# Patient Record
Sex: Female | Born: 1990 | Race: Black or African American | Hispanic: No | Smoking: Never smoker
Health system: Southern US, Community
[De-identification: ages and names within clinical notes are randomized; demographics above are authoritative.]

## PROBLEM LIST (undated history)

## (undated) DIAGNOSIS — Z789 Other specified health status: Secondary | ICD-10-CM

## (undated) HISTORY — PX: NO PAST SURGERIES: SHX2092

---

## 2014-07-06 ENCOUNTER — Emergency Department (HOSPITAL_COMMUNITY)
Admission: EM | Admit: 2014-07-06 | Discharge: 2014-07-06 | Disposition: A | Discharge status: Home / Self Care | Payer: BC Managed Care – PPO | Attending: Emergency Medicine | Admitting: Emergency Medicine

## 2014-07-06 ENCOUNTER — Encounter (HOSPITAL_COMMUNITY): Payer: Self-pay | Admitting: Emergency Medicine

## 2014-07-06 DIAGNOSIS — J029 Acute pharyngitis, unspecified: Secondary | ICD-10-CM | POA: Diagnosis present

## 2014-07-06 DIAGNOSIS — R6883 Chills (without fever): Secondary | ICD-10-CM | POA: Insufficient documentation

## 2014-07-06 LAB — RAPID STREP SCREEN (MED CTR MEBANE ONLY): Streptococcus, Group A Screen (Direct): NEGATIVE

## 2014-07-06 MED ORDER — LIDOCAINE VISCOUS 2 % MT SOLN
15.0000 mL | Freq: Once | OROMUCOSAL | Status: AC
  Administered 2014-07-06: 15 mL via OROMUCOSAL
  Filled 2014-07-06: qty 15

## 2014-07-06 MED ORDER — LIDOCAINE VISCOUS 2 % MT SOLN: 20.0000 mL | OROMUCOSAL | Status: DC | PRN

## 2014-07-06 MED ORDER — IBUPROFEN 800 MG PO TABS: 800.0000 mg | ORAL_TABLET | Freq: Three times a day (TID) | ORAL | Status: DC

## 2014-07-06 NOTE — ED Notes (Signed)
Pt reports onset sore throat Thursday and has noticed white specks in the back of her throat. Pt wants to be checked for strep throat.

## 2014-07-06 NOTE — Discharge Instructions (Signed)

## 2014-07-06 NOTE — ED Provider Notes (Signed)
Medical screening examination/treatment/procedure(s) were performed by non-physician practitioner and as supervising physician I was immediately available for consultation/collaboration.   EKG Interpretation None      Wania Longstreth, MD, FACEP   Grace Thrun L Shakirah Kirkey, MD 07/06/14 2231 

## 2014-07-06 NOTE — ED Provider Notes (Signed)
CSN: [id number]     Arrival date & time 07/06/14  1603 History  This chart was scribed for a non-physician practitioner, Terri Piedraourtney Forcucci, PA-C working with Ward GivensIva L Knapp, MD by [place] Peace, ED Scribe. The patient was seen in [facility]. The patient's care was started at 4:55 PM.    Chief Complaint  Patient presents with  . Sore Throat      Patient is a 23 y.o. female presenting with pharyngitis. The history is provided by the patient. No language interpreter was used.  Sore Throat Pertinent negatives include no shortness of breath.  HPI Comments: Gershon CullLeah Rahn is a 23 y.o. female who presents to the Emergency Department complaining of sore throat onset Thursday with associated chills. Pt notes white specks to back of throat. She has tried taking Tylenol and cough drops without relief. Pt expresses that she thinks she has strep throat. Pt's friend who is here with her at ED states that he has been sick recently but doesn't believe he had sore throat. She denies cough, fever, SOB, chest pain, abdominal pain, nausea, and vomiting.   History reviewed. No pertinent past medical history. History reviewed. No pertinent past surgical history. No family history on file. History  Substance Use Topics  . Smoking status: Never Smoker   . Smokeless tobacco: Not on file  . Alcohol Use: Yes   OB History   Grav Para Term Preterm Abortions TAB SAB Ect Mult Living                 Review of Systems  Constitutional: Positive for chills. Negative for fever.  HENT: Positive for sore throat.   Respiratory: Negative for cough and shortness of breath.   Gastrointestinal: Negative for nausea and vomiting.  All other systems reviewed and are negative.     Allergies  Review of patient's allergies indicates no known allergies.  Home Medications   Prior to Admission medications   Medication Sig Start Date End Date Taking? Authorizing Provider  acetaminophen (TYLENOL) 500 MG tablet Take 1,500 mg by  mouth every 6 (six) hours as needed (sore throat.).   Yes Historical Provider, MD  ibuprofen (ADVIL,MOTRIN) 200 MG tablet Take 600 mg by mouth every 6 (six) hours as needed (sore throat).   Yes Historical Provider, MD  ibuprofen (ADVIL,MOTRIN) 800 MG tablet Take 1 tablet (800 mg total) by mouth 3 (three) times daily. 07/06/14   Ashtan Girtman A Forcucci, PA-C  lidocaine (XYLOCAINE) 2 % solution Use as directed 20 mLs in the mouth or throat as needed for mouth pain. 07/06/14   Arrie Zuercher A Forcucci, PA-C   BP 117/70  Pulse 107  Temp(Src) 99.2 F (37.3 C) (Oral)  Resp 20  SpO2 100%  LMP 06/08/2014 Physical Exam  Nursing note and vitals reviewed. Constitutional: She is oriented to person, place, and time. She appears well-developed and well-nourished. No distress.  HENT:  Head: Normocephalic and atraumatic.  Mouth/Throat: Oropharyngeal exudate and posterior oropharyngeal edema present.  Eyes: Conjunctivae and EOM are normal. Pupils are equal, round, and reactive to light.  Neck: Neck supple. No tracheal deviation present.  Cardiovascular: Normal rate, regular rhythm and normal heart sounds.  Exam reveals no gallop and no friction rub.   No murmur heard. Pulmonary/Chest: Effort normal and breath sounds normal. No respiratory distress. She has no wheezes. She has no rales.  Musculoskeletal: Normal range of motion.  Lymphadenopathy:    She has cervical adenopathy.  Neurological: She is alert and oriented to person, place, and time.  Skin: Skin is warm and dry.  Psychiatric: She has a normal mood and affect. Her behavior is normal.    ED Course  Procedures (including critical care time) Labs Review Labs Reviewed  RAPID STREP SCREEN  CULTURE, GROUP A STREP    Results for orders placed during the hospital encounter of 07/06/14  RAPID STREP SCREEN      Result Value Ref Range   Streptococcus, Group A Screen (Direct) NEGATIVE  NEGATIVE   No results found.   Imaging Review No results  found.   EKG Interpretation None     Medications  lidocaine (XYLOCAINE) 2 % viscous mouth solution 15 mL (not administered)    4:59 PM- Treatment plan was discussed with patient who verbalizes understanding and agrees.   MDM   Final diagnoses:  Viral pharyngitis   Patient is a 23 y.o. feamle who presents to the ED with sore throat.  Patient is afebrile and non-toxic appearing.  There is no evidence of peritonsillar abscess or trismus.  Rapid strep is negative.  Suspect that this is likely viral pharyngitis.  Will send the patient home with ibuprofen 800 mg TID and viscous lidocaine for symptomatic relief.  Patient states understanding and agreement.    I personally performed the services described in this documentation, which was scribed in my presence. The recorded information has been reviewed and is accurate.   Eben Burowourtney A Forcucci, PA-C 07/06/14 1723

## 2014-07-08 LAB — CULTURE, GROUP A STREP

## 2015-11-23 ENCOUNTER — Other Ambulatory Visit (HOSPITAL_COMMUNITY): Payer: Self-pay | Admitting: Nurse Practitioner

## 2015-11-23 DIAGNOSIS — Z369 Encounter for antenatal screening, unspecified: Secondary | ICD-10-CM

## 2015-11-23 LAB — OB RESULTS CONSOLE HEPATITIS B SURFACE ANTIGEN: Hepatitis B Surface Ag: NEGATIVE

## 2015-11-23 LAB — OB RESULTS CONSOLE RUBELLA ANTIBODY, IGM: RUBELLA: IMMUNE

## 2015-11-27 ENCOUNTER — Encounter (HOSPITAL_COMMUNITY): Payer: Self-pay

## 2015-11-27 ENCOUNTER — Ambulatory Visit (HOSPITAL_COMMUNITY)
Admission: RE | Admit: 2015-11-27 | Discharge: 2015-11-27 | Disposition: A | Discharge status: Home / Self Care | Payer: Medicaid Other | Source: Ambulatory Visit | Attending: Nurse Practitioner | Admitting: Nurse Practitioner

## 2015-11-27 DIAGNOSIS — Z369 Encounter for antenatal screening, unspecified: Secondary | ICD-10-CM

## 2015-11-27 DIAGNOSIS — Z36 Encounter for antenatal screening of mother: Secondary | ICD-10-CM | POA: Insufficient documentation

## 2015-11-27 DIAGNOSIS — Z3A13 13 weeks gestation of pregnancy: Secondary | ICD-10-CM | POA: Diagnosis not present

## 2015-12-07 ENCOUNTER — Other Ambulatory Visit (HOSPITAL_COMMUNITY): Payer: Self-pay

## 2015-12-17 ENCOUNTER — Other Ambulatory Visit (HOSPITAL_COMMUNITY): Payer: Self-pay | Admitting: Nurse Practitioner

## 2015-12-17 DIAGNOSIS — Z3689 Encounter for other specified antenatal screening: Secondary | ICD-10-CM

## 2015-12-17 DIAGNOSIS — Z3A19 19 weeks gestation of pregnancy: Secondary | ICD-10-CM

## 2016-01-08 ENCOUNTER — Ambulatory Visit (HOSPITAL_COMMUNITY)
Admission: RE | Admit: 2016-01-08 | Discharge: 2016-01-08 | Disposition: A | Discharge status: Home / Self Care | Payer: Medicaid Other | Source: Ambulatory Visit | Attending: Nurse Practitioner | Admitting: Nurse Practitioner

## 2016-01-08 DIAGNOSIS — Z36 Encounter for antenatal screening of mother: Secondary | ICD-10-CM | POA: Insufficient documentation

## 2016-01-08 DIAGNOSIS — Z3689 Encounter for other specified antenatal screening: Secondary | ICD-10-CM

## 2016-01-08 DIAGNOSIS — Z3A19 19 weeks gestation of pregnancy: Secondary | ICD-10-CM | POA: Diagnosis not present

## 2016-03-09 LAB — OB RESULTS CONSOLE GC/CHLAMYDIA
Chlamydia: NEGATIVE
Gonorrhea: NEGATIVE

## 2016-03-09 LAB — OB RESULTS CONSOLE HIV ANTIBODY (ROUTINE TESTING): HIV: NONREACTIVE

## 2016-05-11 LAB — OB RESULTS CONSOLE GBS: STREP GROUP B AG: POSITIVE

## 2016-05-27 ENCOUNTER — Encounter (HOSPITAL_COMMUNITY): Payer: Self-pay | Admitting: *Deleted

## 2016-05-27 ENCOUNTER — Inpatient Hospital Stay (HOSPITAL_COMMUNITY)
Admission: AD | Admit: 2016-05-27 | Discharge: 2016-05-27 | Disposition: A | Discharge status: Home / Self Care | Payer: Medicaid Other | Source: Ambulatory Visit | Attending: Obstetrics and Gynecology | Admitting: Obstetrics and Gynecology

## 2016-05-27 DIAGNOSIS — Z3A37 37 weeks gestation of pregnancy: Secondary | ICD-10-CM | POA: Insufficient documentation

## 2016-05-27 DIAGNOSIS — N858 Other specified noninflammatory disorders of uterus: Secondary | ICD-10-CM | POA: Diagnosis present

## 2016-05-27 HISTORY — DX: Other specified health status: Z78.9

## 2016-05-27 NOTE — Discharge Instructions (Signed)
Fetal Movement Counts °Patient Name: __________________________________________________ Patient Due Date: ____________________ °Performing a fetal movement count is highly recommended in high-risk pregnancies, but it is good for every pregnant woman to do. Your health care provider may ask you to start counting fetal movements at 28 weeks of the pregnancy. Fetal movements often increase: °· After eating a full meal. °· After physical activity. °· After eating or drinking something sweet or cold. °· At rest. °Pay attention to when you feel the baby is most active. This will help you notice a pattern of your baby's sleep and wake cycles and what factors contribute to an increase in fetal movement. It is important to perform a fetal movement count at the same time each day when your baby is normally most active.  °HOW TO COUNT FETAL MOVEMENTS °1. Find a quiet and comfortable area to sit or lie down on your left side. Lying on your left side provides the best blood and oxygen circulation to your baby. °2. Write down the day and time on a sheet of paper or in a journal. °3. Start counting kicks, flutters, swishes, rolls, or jabs in a 2-hour period. You should feel at least 10 movements within 2 hours. °4. If you do not feel 10 movements in 2 hours, wait 2-3 hours and count again. Look for a change in the pattern or not enough counts in 2 hours. °SEEK MEDICAL CARE IF: °· You feel less than 10 counts in 2 hours, tried twice. °· There is no movement in over an hour. °· The pattern is changing or taking longer each day to reach 10 counts in 2 hours. °· You feel the baby is not moving as he or she usually does. °Date: ____________ Movements: ____________ Start time: ____________ Finish time: ____________  °Date: ____________ Movements: ____________ Start time: ____________ Finish time: ____________ °Date: ____________ Movements: ____________ Start time: ____________ Finish time: ____________ °Date: ____________ Movements:  ____________ Start time: ____________ Finish time: ____________ °Date: ____________ Movements: ____________ Start time: ____________ Finish time: ____________ °Date: ____________ Movements: ____________ Start time: ____________ Finish time: ____________ °Date: ____________ Movements: ____________ Start time: ____________ Finish time: ____________ °Date: ____________ Movements: ____________ Start time: ____________ Finish time: ____________  °Date: ____________ Movements: ____________ Start time: ____________ Finish time: ____________ °Date: ____________ Movements: ____________ Start time: ____________ Finish time: ____________ °Date: ____________ Movements: ____________ Start time: ____________ Finish time: ____________ °Date: ____________ Movements: ____________ Start time: ____________ Finish time: ____________ °Date: ____________ Movements: ____________ Start time: ____________ Finish time: ____________ °Date: ____________ Movements: ____________ Start time: ____________ Finish time: ____________ °Date: ____________ Movements: ____________ Start time: ____________ Finish time: ____________  °Date: ____________ Movements: ____________ Start time: ____________ Finish time: ____________ °Date: ____________ Movements: ____________ Start time: ____________ Finish time: ____________ °Date: ____________ Movements: ____________ Start time: ____________ Finish time: ____________ °Date: ____________ Movements: ____________ Start time: ____________ Finish time: ____________ °Date: ____________ Movements: ____________ Start time: ____________ Finish time: ____________ °Date: ____________ Movements: ____________ Start time: ____________ Finish time: ____________ °Date: ____________ Movements: ____________ Start time: ____________ Finish time: ____________  °Date: ____________ Movements: ____________ Start time: ____________ Finish time: ____________ °Date: ____________ Movements: ____________ Start time: ____________ Finish  time: ____________ °Date: ____________ Movements: ____________ Start time: ____________ Finish time: ____________ °Date: ____________ Movements: ____________ Start time: ____________ Finish time: ____________ °Date: ____________ Movements: ____________ Start time: ____________ Finish time: ____________ °Date: ____________ Movements: ____________ Start time: ____________ Finish time: ____________ °Date: ____________ Movements: ____________ Start time: ____________ Finish time: ____________  °Date: ____________ Movements: ____________ Start time: ____________ Finish   time: ____________ °Date: ____________ Movements: ____________ Start time: ____________ Finish time: ____________ °Date: ____________ Movements: ____________ Start time: ____________ Finish time: ____________ °Date: ____________ Movements: ____________ Start time: ____________ Finish time: ____________ °Date: ____________ Movements: ____________ Start time: ____________ Finish time: ____________ °Date: ____________ Movements: ____________ Start time: ____________ Finish time: ____________ °Date: ____________ Movements: ____________ Start time: ____________ Finish time: ____________  °Date: ____________ Movements: ____________ Start time: ____________ Finish time: ____________ °Date: ____________ Movements: ____________ Start time: ____________ Finish time: ____________ °Date: ____________ Movements: ____________ Start time: ____________ Finish time: ____________ °Date: ____________ Movements: ____________ Start time: ____________ Finish time: ____________ °Date: ____________ Movements: ____________ Start time: ____________ Finish time: ____________ °Date: ____________ Movements: ____________ Start time: ____________ Finish time: ____________ °Date: ____________ Movements: ____________ Start time: ____________ Finish time: ____________  °Date: ____________ Movements: ____________ Start time: ____________ Finish time: ____________ °Date: ____________  Movements: ____________ Start time: ____________ Finish time: ____________ °Date: ____________ Movements: ____________ Start time: ____________ Finish time: ____________ °Date: ____________ Movements: ____________ Start time: ____________ Finish time: ____________ °Date: ____________ Movements: ____________ Start time: ____________ Finish time: ____________ °Date: ____________ Movements: ____________ Start time: ____________ Finish time: ____________ °Date: ____________ Movements: ____________ Start time: ____________ Finish time: ____________  °Date: ____________ Movements: ____________ Start time: ____________ Finish time: ____________ °Date: ____________ Movements: ____________ Start time: ____________ Finish time: ____________ °Date: ____________ Movements: ____________ Start time: ____________ Finish time: ____________ °Date: ____________ Movements: ____________ Start time: ____________ Finish time: ____________ °Date: ____________ Movements: ____________ Start time: ____________ Finish time: ____________ °Date: ____________ Movements: ____________ Start time: ____________ Finish time: ____________ °  °This information is not intended to replace advice given to you by your health care provider. Make sure you discuss any questions you have with your health care provider. °  °Document Released: 09/28/2006 Document Revised: 09/19/2014 Document Reviewed: 06/25/2012 °Elsevier Interactive Patient Education ©2016 [employer] °Third Trimester of Pregnancy °The third trimester is from week 29 through week 42, months 7 through 9. The third trimester is a time when the fetus is growing rapidly. At the end of the ninth month, the fetus is about 20 inches in length and weighs 6-10 pounds.  °BODY CHANGES °Your body goes through many changes during pregnancy. The changes vary from woman to woman.  °· Your weight will continue to increase. You can expect to gain 25-35 pounds (11-16 kg) by the end of the pregnancy. °· You may  begin to get stretch marks on your hips, abdomen, and breasts. °· You may urinate more often because the fetus is moving lower into your pelvis and pressing on your bladder. °· You may develop or continue to have heartburn as a result of your pregnancy. °· You may develop constipation because certain hormones are causing the muscles that push waste through your intestines to slow down. °· You may develop hemorrhoids or swollen, bulging veins (varicose veins). °· You may have pelvic pain because of the weight gain and pregnancy hormones relaxing your joints between the bones in your pelvis. Backaches may result from overexertion of the muscles supporting your posture. °· You may have changes in your hair. These can include thickening of your hair, rapid growth, and changes in texture. Some women also have hair loss during or after pregnancy, or hair that feels dry or thin. Your hair will most likely return to normal after your baby is born. °· Your breasts will continue to grow and be tender. A yellow discharge may leak from your breasts called colostrum. °· Your belly button may stick out. °·   You may feel short of breath because of your expanding uterus.  You may notice the fetus "dropping," or moving lower in your abdomen.  You may have a bloody mucus discharge. This usually occurs a few days to a week before labor begins.  Your cervix becomes thin and soft (effaced) near your due date. WHAT TO EXPECT AT YOUR PRENATAL EXAMS  You will have prenatal exams every 2 weeks until week 36. Then, you will have weekly prenatal exams. During a routine prenatal visit:  You will be weighed to make sure you and the fetus are growing normally.  Your blood pressure is taken.  Your abdomen will be measured to track your baby's growth.  The fetal heartbeat will be listened to.  Any test results from the previous visit will be discussed.  You may have a cervical check near your due date to see if you have  effaced. At around 36 weeks, your caregiver will check your cervix. At the same time, your caregiver will also perform a test on the secretions of the vaginal tissue. This test is to determine if a type of bacteria, Group B streptococcus, is present. Your caregiver will explain this further. Your caregiver may ask you:  What your birth plan is.  How you are feeling.  If you are feeling the baby move.  If you have had any abnormal symptoms, such as leaking fluid, bleeding, severe headaches, or abdominal cramping.  If you are using any tobacco products, including cigarettes, chewing tobacco, and electronic cigarettes.  If you have any questions. Other tests or screenings that may be performed during your third trimester include:  Blood tests that check for low iron levels (anemia).  Fetal testing to check the health, activity level, and growth of the fetus. Testing is done if you have certain medical conditions or if there are problems during the pregnancy.  HIV (human immunodeficiency virus) testing. If you are at high risk, you may be screened for HIV during your third trimester of pregnancy. FALSE LABOR You may feel small, irregular contractions that eventually go away. These are called Braxton Hicks contractions, or false labor. Contractions may last for hours, days, or even weeks before true labor sets in. If contractions come at regular intervals, intensify, or become painful, it is best to be seen by your caregiver.  SIGNS OF LABOR   Menstrual-like cramps.  Contractions that are 5 minutes apart or less.  Contractions that start on the top of the uterus and spread down to the lower abdomen and back.  A sense of increased pelvic pressure or back pain.  A watery or bloody mucus discharge that comes from the vagina. If you have any of these signs before the 37th week of pregnancy, call your caregiver right away. You need to go to the hospital to get checked immediately. HOME CARE  INSTRUCTIONS   Avoid all smoking, herbs, alcohol, and unprescribed drugs. These chemicals affect the formation and growth of the baby.  Do not use any tobacco products, including cigarettes, chewing tobacco, and electronic cigarettes. If you need help quitting, ask your health care provider. You may receive counseling support and other resources to help you quit.  Follow your caregiver's instructions regarding medicine use. There are medicines that are either safe or unsafe to take during pregnancy.  Exercise only as directed by your caregiver. Experiencing uterine cramps is a good sign to stop exercising.  Continue to eat regular, healthy meals.  Wear a good support bra for  breast tenderness.  Do not use hot tubs, steam rooms, or saunas.  Wear your seat belt at all times when driving.  Avoid raw meat, uncooked cheese, cat litter boxes, and soil used by cats. These carry germs that can cause birth defects in the baby.  Take your prenatal vitamins.  Take 1500-2000 mg of calcium daily starting at the 20th week of pregnancy until you deliver your baby.  Try taking a stool softener (if your caregiver approves) if you develop constipation. Eat more high-fiber foods, such as fresh vegetables or fruit and whole grains. Drink plenty of fluids to keep your urine clear or pale yellow.  Take warm sitz baths to soothe any pain or discomfort caused by hemorrhoids. Use hemorrhoid cream if your caregiver approves.  If you develop varicose veins, wear support hose. Elevate your feet for 15 minutes, 3-4 times a day. Limit salt in your diet.  Avoid heavy lifting, wear low heal shoes, and practice good posture.  Rest a lot with your legs elevated if you have leg cramps or low back pain.  Visit your dentist if you have not gone during your pregnancy. Use a soft toothbrush to brush your teeth and be gentle when you floss.  A sexual relationship may be continued unless your caregiver directs you  otherwise.  Do not travel far distances unless it is absolutely necessary and only with the approval of your caregiver.  Take prenatal classes to understand, practice, and ask questions about the labor and delivery.  Make a trial run to the hospital.  Pack your hospital bag.  Prepare the baby's nursery.  Continue to go to all your prenatal visits as directed by your caregiver. SEEK MEDICAL CARE IF:  You are unsure if you are in labor or if your water has broken.  You have dizziness.  You have mild pelvic cramps, pelvic pressure, or nagging pain in your abdominal area.  You have persistent nausea, vomiting, or diarrhea.  You have a bad smelling vaginal discharge.  You have pain with urination. SEEK IMMEDIATE MEDICAL CARE IF:   You have a fever.  You are leaking fluid from your vagina.  You have spotting or bleeding from your vagina.  You have severe abdominal cramping or pain.  You have rapid weight loss or gain.  You have shortness of breath with chest pain.  You notice sudden or extreme swelling of your face, hands, ankles, feet, or legs.  You have not felt your baby move in over an hour.  You have severe headaches that do not go away with medicine.  You have vision changes.   This information is not intended to replace advice given to you by your health care provider. Make sure you discuss any questions you have with your health care provider.   Document Released: 08/23/2001 Document Revised: 09/19/2014 Document Reviewed: 10/30/2012 Elsevier Interactive Patient Education 2016 [employer] Inc[employer] Ball CorporationBraxton Hicks Contractions Contractions of the uterus can occur throughout pregnancy. Contractions are not always a sign that you are in labor.  WHAT ARE BRAXTON HICKS CONTRACTIONS?  Contractions that occur before labor are called Braxton Hicks contractions, or false labor. Toward the end of pregnancy (32-34 weeks), these contractions can develop more often and may become  more forceful. This is not true labor because these contractions do not result in opening (dilatation) and thinning of the cervix. They are sometimes difficult to tell apart from true labor because these contractions can be forceful and people have different pain tolerances. You should  not feel embarrassed if you go to the hospital with false labor. Sometimes, the only way to tell if you are in true labor is for your health care provider to look for changes in the cervix. If there are no prenatal problems or other health problems associated with the pregnancy, it is completely safe to be sent home with false labor and await the onset of true labor. HOW CAN YOU TELL THE DIFFERENCE BETWEEN TRUE AND FALSE LABOR? False Labor  The contractions of false labor are usually shorter and not as hard as those of true labor.   The contractions are usually irregular.   The contractions are often felt in the front of the lower abdomen and in the groin.   The contractions may go away when you walk around or change positions while lying down.   The contractions get weaker and are shorter lasting as time goes on.   The contractions do not usually become progressively stronger, regular, and closer together as with true labor.  True Labor  Contractions in true labor last 30-70 seconds, become very regular, usually become more intense, and increase in frequency.   The contractions do not go away with walking.   The discomfort is usually felt in the top of the uterus and spreads to the lower abdomen and low back.   True labor can be determined by your health care provider with an exam. This will show that the cervix is dilating and getting thinner.  WHAT TO REMEMBER  Keep up with your usual exercises and follow other instructions given by your health care provider.   Take medicines as directed by your health care provider.   Keep your regular prenatal appointments.   Eat and drink lightly if  you think you are going into labor.   If Braxton Hicks contractions are making you uncomfortable:   Change your position from lying down or resting to walking, or from walking to resting.   Sit and rest in a tub of warm water.   Drink 2-3 glasses of water. Dehydration may cause these contractions.   Do slow and deep breathing several times an hour.  WHEN SHOULD I SEEK IMMEDIATE MEDICAL CARE? Seek immediate medical care if:  Your contractions become stronger, more regular, and closer together.   You have fluid leaking or gushing from your vagina.   You have a fever.   You pass blood-tinged mucus.   You have vaginal bleeding.   You have continuous abdominal pain.   You have low back pain that you never had before.   You feel your baby's head pushing down and causing pelvic pressure.   Your baby is not moving as much as it used to.    This information is not intended to replace advice given to you by your health care provider. Make sure you discuss any questions you have with your health care provider.   Document Released: 08/29/2005 Document Revised: 09/03/2013 Document Reviewed: 06/10/2013 Elsevier Interactive Patient Education [employer]

## 2016-05-27 NOTE — MAU Note (Signed)
PT SAYS SHE HAD UC HURTING   BAD  AT 8PM. PNC-  HD.  VE   AT 37 WEEKS -   1 CM.     DENIES HSV AND  MRSA.  GBS- POSITIVE

## 2016-05-28 ENCOUNTER — Inpatient Hospital Stay (HOSPITAL_COMMUNITY)
Admission: AD | Admit: 2016-05-28 | Discharge: 2016-05-29 | Disposition: A | Discharge status: Home / Self Care | Payer: Medicaid Other | Source: Ambulatory Visit | Attending: Obstetrics & Gynecology | Admitting: Obstetrics & Gynecology

## 2016-05-28 ENCOUNTER — Encounter (HOSPITAL_COMMUNITY): Payer: Self-pay | Admitting: *Deleted

## 2016-05-28 MED ORDER — OXYCODONE-ACETAMINOPHEN 5-325 MG PO TABS
1.0000 | ORAL_TABLET | Freq: Once | ORAL | Status: AC
  Administered 2016-05-28: 1 via ORAL
  Filled 2016-05-28: qty 1

## 2016-05-28 NOTE — Progress Notes (Signed)
Pt to ambulate for one hour then return to MAU for reevaluation

## 2016-05-29 ENCOUNTER — Inpatient Hospital Stay (HOSPITAL_COMMUNITY)
Admission: AD | Admit: 2016-05-29 | Discharge: 2016-05-29 | Disposition: A | Discharge status: Home / Self Care | Payer: Medicaid Other | Source: Ambulatory Visit | Attending: Obstetrics and Gynecology | Admitting: Obstetrics and Gynecology

## 2016-05-29 ENCOUNTER — Encounter (HOSPITAL_COMMUNITY): Payer: Self-pay | Admitting: *Deleted

## 2016-05-29 NOTE — MAU Note (Signed)
Pt states her contractions are more intense today.  Pt states she has some contractions that are back to back and others that are 5 minutes apart.  Pt states she is feeling the baby move.  Pt denies water breaking.  Pt states she feels like her mucus plug is still coming out.

## 2016-05-29 NOTE — Discharge Instructions (Signed)
Vaginal Delivery °During delivery, your health care provider will help you give birth to your baby. During a vaginal delivery, you will work to push the baby out of your vagina. However, before you can push your baby out, a few things need to happen. The opening of your uterus (cervix) has to soften, thin out, and open up (dilate) all the way to 10 cm. Also, your baby has to move down from the uterus into your vagina.  °SIGNS OF LABOR  °Your health care provider will first need to make sure you are in labor. Signs of labor include:  °· Passing what is called the mucous plug before labor begins. This is a small amount of blood-stained mucus. °· Having regular, painful uterine contractions.   °· The time between contractions gets shorter.   °· The discomfort and pain gradually get more intense. °· Contraction pains get worse when walking and do not go away when resting.   °· Your cervix becomes thinner (effacement) and dilates. °BEFORE THE DELIVERY °Once you are in labor and admitted into the hospital or care center, your health care provider may do the following:  °· Perform a complete physical exam. °· Review any complications related to pregnancy or labor.  °· Check your blood pressure, pulse, temperature, and heart rate (vital signs).   °· Determine if, and when, the rupture of amniotic membranes occurred. °· Do a vaginal exam (using a sterile glove and lubricant) to determine:   °¨ The position (presentation) of the baby. Is the baby's head presenting first (vertex) in the birth canal (vagina), or are the feet or buttocks first (breech)?   °¨ The level (station) of the baby's head within the birth canal.   °¨ The effacement and dilatation of the cervix.   °· An electronic fetal monitor is usually placed on your abdomen when you first arrive. This is used to monitor your contractions and the baby's heart rate. °¨ When the monitor is on your abdomen (external fetal monitor), it can only pick up the frequency and  length of your contractions. It cannot tell the strength of your contractions. °¨ If it becomes necessary for your health care provider to know exactly how strong your contractions are or to see exactly what the baby's heart rate is doing, an internal monitor may be inserted into your vagina and uterus. Your health care provider will discuss the benefits and risks of using an internal monitor and obtain your permission before inserting the device. °¨ Continuous fetal monitoring may be needed if you have an epidural, are receiving certain medicines (such as oxytocin), or have pregnancy or labor complications. °· An IV access tube may be placed into a vein in your arm to deliver fluids and medicines if necessary. °THREE STAGES OF LABOR AND DELIVERY °Normal labor and delivery is divided into three stages. °First Stage °This stage starts when you begin to contract regularly and your cervix begins to efface and dilate. It ends when your cervix is completely open (fully dilated). The first stage is the longest stage of labor and can last from 3 hours to 15 hours.  °Several methods are available to help with labor pain. You and your health care provider will decide which option is best for you. Options include:  °· Opioid medicines. These are strong pain medicines that you can get through your IV tube or as a shot into your muscle. These medicines lessen pain but do not make it go away completely.  °· Epidural. A medicine is given through a thin tube that   is inserted in your back. The medicine numbs the lower part of your body and prevents any pain in that area.  Paracervical pain medicine. This is an injection of an anesthetic on each side of your cervix.   You may request natural childbirth, which does not involve the use of pain medicines or an epidural during labor and delivery. Instead, you will use other things, such as breathing exercises, to help cope with the pain. Second Stage The second stage of labor  begins when your cervix is fully dilated at 10 cm. It continues until you push your baby down through the birth canal and the baby is born. This stage can take only minutes or several hours.  The location of your baby's head as it moves through the birth canal is reported as a number called a station. If the baby's head has not started its descent, the station is described as being at minus 3 (-3). When your baby's head is at the zero station, it is at the middle of the birth canal and is engaged in the pelvis. The station of your baby helps indicate the progress of the second stage of labor.  When your baby is born, your health care provider may hold the baby with his or her head lowered to prevent amniotic fluid, mucus, and blood from getting into the baby's lungs. The baby's mouth and nose may be suctioned with a small bulb syringe to remove any additional fluid.  Your health care provider may then place the baby on your stomach. It is important to keep the baby from getting cold. To do this, the health care provider will dry the baby off, place the baby directly on your skin (with no blankets between you and the baby), and cover the baby with warm, dry blankets.   The umbilical cord is cut. Third Stage During the third stage of labor, your health care provider will deliver the placenta (afterbirth) and make sure your bleeding is under control. The delivery of the placenta usually takes about 5 minutes but can take up to 30 minutes. After the placenta is delivered, a medicine may be given either by IV or injection to help contract the uterus and control bleeding. If you are planning to breastfeed, you can try to do so now. After you deliver the placenta, your uterus should contract and get very firm. If your uterus does not remain firm, your health care provider will massage it. This is important because the contraction of the uterus helps cut off bleeding at the site where the placenta was attached  to your uterus. If your uterus does not contract properly and stay firm, you may continue to bleed heavily. If there is a lot of bleeding, medicines may be given to contract the uterus and stop the bleeding.    This information is not intended to replace advice given to you by your health care provider. Make sure you discuss any questions you have with your health care provider.   Document Released: 06/07/2008 Document Revised: 09/19/2014 Document Reviewed: 04/25/2012 Elsevier Interactive Patient Education [employer] Third Trimester of Pregnancy The third trimester is from week 29 through week 42, months 7 through 9. The third trimester is a time when the fetus is growing rapidly. At the end of the ninth month, the fetus is about 20 inches in length and weighs 6-10 pounds.  BODY CHANGES Your body goes through many changes during pregnancy. The changes vary from woman to woman.  Your weight will continue to increase. You can expect to gain 25-35 pounds (11-16 kg) by the end of the pregnancy.  You may begin to get stretch marks on your hips, abdomen, and breasts.  You may urinate more often because the fetus is moving lower into your pelvis and pressing on your bladder.  You may develop or continue to have heartburn as a result of your pregnancy.  You may develop constipation because certain hormones are causing the muscles that push waste through your intestines to slow down.  You may develop hemorrhoids or swollen, bulging veins (varicose veins).  You may have pelvic pain because of the weight gain and pregnancy hormones relaxing your joints between the bones in your pelvis. Backaches may result from overexertion of the muscles supporting your posture.  You may have changes in your hair. These can include thickening of your hair, rapid growth, and changes in texture. Some women also have hair loss during or after pregnancy, or hair that feels dry or thin. Your hair will most likely  return to normal after your baby is born.  Your breasts will continue to grow and be tender. A yellow discharge may leak from your breasts called colostrum.  Your belly button may stick out.  You may feel short of breath because of your expanding uterus.  You may notice the fetus "dropping," or moving lower in your abdomen.  You may have a bloody mucus discharge. This usually occurs a few days to a week before labor begins.  Your cervix becomes thin and soft (effaced) near your due date. WHAT TO EXPECT AT YOUR PRENATAL EXAMS  You will have prenatal exams every 2 weeks until week 36. Then, you will have weekly prenatal exams. During a routine prenatal visit:  You will be weighed to make sure you and the fetus are growing normally.  Your blood pressure is taken.  Your abdomen will be measured to track your baby's growth.  The fetal heartbeat will be listened to.  Any test results from the previous visit will be discussed.  You may have a cervical check near your due date to see if you have effaced. At around 36 weeks, your caregiver will check your cervix. At the same time, your caregiver will also perform a test on the secretions of the vaginal tissue. This test is to determine if a type of bacteria, Group B streptococcus, is present. Your caregiver will explain this further. Your caregiver may ask you:  What your birth plan is.  How you are feeling.  If you are feeling the baby move.  If you have had any abnormal symptoms, such as leaking fluid, bleeding, severe headaches, or abdominal cramping.  If you are using any tobacco products, including cigarettes, chewing tobacco, and electronic cigarettes.  If you have any questions. Other tests or screenings that may be performed during your third trimester include:  Blood tests that check for low iron levels (anemia).  Fetal testing to check the health, activity level, and growth of the fetus. Testing is done if you have  certain medical conditions or if there are problems during the pregnancy.  HIV (human immunodeficiency virus) testing. If you are at high risk, you may be screened for HIV during your third trimester of pregnancy. FALSE LABOR You may feel small, irregular contractions that eventually go away. These are called Braxton Hicks contractions, or false labor. Contractions may last for hours, days, or even weeks before true labor sets in. If contractions come  at regular intervals, intensify, or become painful, it is best to be seen by your caregiver.  SIGNS OF LABOR   Menstrual-like cramps.  Contractions that are 5 minutes apart or less.  Contractions that start on the top of the uterus and spread down to the lower abdomen and back.  A sense of increased pelvic pressure or back pain.  A watery or bloody mucus discharge that comes from the vagina. If you have any of these signs before the 37th week of pregnancy, call your caregiver right away. You need to go to the hospital to get checked immediately. HOME CARE INSTRUCTIONS   Avoid all smoking, herbs, alcohol, and unprescribed drugs. These chemicals affect the formation and growth of the baby.  Do not use any tobacco products, including cigarettes, chewing tobacco, and electronic cigarettes. If you need help quitting, ask your health care provider. You may receive counseling support and other resources to help you quit.  Follow your caregiver's instructions regarding medicine use. There are medicines that are either safe or unsafe to take during pregnancy.  Exercise only as directed by your caregiver. Experiencing uterine cramps is a good sign to stop exercising.  Continue to eat regular, healthy meals.  Wear a good support bra for breast tenderness.  Do not use hot tubs, steam rooms, or saunas.  Wear your seat belt at all times when driving.  Avoid raw meat, uncooked cheese, cat litter boxes, and soil used by cats. These carry germs that  can cause birth defects in the baby.  Take your prenatal vitamins.  Take 1500-2000 mg of calcium daily starting at the 20th week of pregnancy until you deliver your baby.  Try taking a stool softener (if your caregiver approves) if you develop constipation. Eat more high-fiber foods, such as fresh vegetables or fruit and whole grains. Drink plenty of fluids to keep your urine clear or pale yellow.  Take warm sitz baths to soothe any pain or discomfort caused by hemorrhoids. Use hemorrhoid cream if your caregiver approves.  If you develop varicose veins, wear support hose. Elevate your feet for 15 minutes, 3-4 times a day. Limit salt in your diet.  Avoid heavy lifting, wear low heal shoes, and practice good posture.  Rest a lot with your legs elevated if you have leg cramps or low back pain.  Visit your dentist if you have not gone during your pregnancy. Use a soft toothbrush to brush your teeth and be gentle when you floss.  A sexual relationship may be continued unless your caregiver directs you otherwise.  Do not travel far distances unless it is absolutely necessary and only with the approval of your caregiver.  Take prenatal classes to understand, practice, and ask questions about the labor and delivery.  Make a trial run to the hospital.  Pack your hospital bag.  Prepare the baby's nursery.  Continue to go to all your prenatal visits as directed by your caregiver. SEEK MEDICAL CARE IF:  You are unsure if you are in labor or if your water has broken.  You have dizziness.  You have mild pelvic cramps, pelvic pressure, or nagging pain in your abdominal area.  You have persistent nausea, vomiting, or diarrhea.  You have a bad smelling vaginal discharge.  You have pain with urination. SEEK IMMEDIATE MEDICAL CARE IF:   You have a fever.  You are leaking fluid from your vagina.  You have spotting or bleeding from your vagina.  You have severe abdominal cramping or  pain.  You have rapid weight loss or gain.  You have shortness of breath with chest pain.  You notice sudden or extreme swelling of your face, hands, ankles, feet, or legs.  You have not felt your baby move in over an hour.  You have severe headaches that do not go away with medicine.  You have vision changes.   This information is not intended to replace advice given to you by your health care provider. Make sure you discuss any questions you have with your health care provider.   Document Released: 08/23/2001 Document Revised: 09/19/2014 Document Reviewed: 10/30/2012 Elsevier Interactive Patient Education 2016 [employer] Inc[employer] Fetal Movement Counts Patient Name: __________________________________________________ Patient Due Date: ____________________ Performing a fetal movement count is highly recommended in high-risk pregnancies, but it is good for every pregnant woman to do. Your health care provider may ask you to start counting fetal movements at 28 weeks of the pregnancy. Fetal movements often increase:  After eating a full meal.  After physical activity.  After eating or drinking something sweet or cold.  At rest. Pay attention to when you feel the baby is most active. This will help you notice a pattern of your baby's sleep and wake cycles and what factors contribute to an increase in fetal movement. It is important to perform a fetal movement count at the same time each day when your baby is normally most active.  HOW TO COUNT FETAL MOVEMENTS 1. Find a quiet and comfortable area to sit or lie down on your left side. Lying on your left side provides the best blood and oxygen circulation to your baby. 2. Write down the day and time on a sheet of paper or in a journal. 3. Start counting kicks, flutters, swishes, rolls, or jabs in a 2-hour period. You should feel at least 10 movements within 2 hours. 4. If you do not feel 10 movements in 2 hours, wait 2-3 hours and count again.  Look for a change in the pattern or not enough counts in 2 hours. SEEK MEDICAL CARE IF:  You feel less than 10 counts in 2 hours, tried twice.  There is no movement in over an hour.  The pattern is changing or taking longer each day to reach 10 counts in 2 hours.  You feel the baby is not moving as he or she usually does. Date: ____________ Movements: ____________ Start time: ____________ Doreatha Martin time: ____________  Date: ____________ Movements: ____________ Start time: ____________ Doreatha Martin time: ____________ Date: ____________ Movements: ____________ Start time: ____________ Doreatha Martin time: ____________ Date: ____________ Movements: ____________ Start time: ____________ Doreatha Martin time: ____________ Date: ____________ Movements: ____________ Start time: ____________ Doreatha Martin time: ____________ Date: ____________ Movements: ____________ Start time: ____________ Doreatha Martin time: ____________ Date: ____________ Movements: ____________ Start time: ____________ Doreatha Martin time: ____________ Date: ____________ Movements: ____________ Start time: ____________ Doreatha Martin time: ____________  Date: ____________ Movements: ____________ Start time: ____________ Doreatha Martin time: ____________ Date: ____________ Movements: ____________ Start time: ____________ Doreatha Martin time: ____________ Date: ____________ Movements: ____________ Start time: ____________ Doreatha Martin time: ____________ Date: ____________ Movements: ____________ Start time: ____________ Doreatha Martin time: ____________ Date: ____________ Movements: ____________ Start time: ____________ Doreatha Martin time: ____________ Date: ____________ Movements: ____________ Start time: ____________ Doreatha Martin time: ____________ Date: ____________ Movements: ____________ Start time: ____________ Doreatha Martin time: ____________  Date: ____________ Movements: ____________ Start time: ____________ Doreatha Martin time: ____________ Date: ____________ Movements: ____________ Start time: ____________ Doreatha Martin time:  ____________ Date: ____________ Movements: ____________ Start time: ____________ Doreatha Martin time: ____________ Date: ____________ Movements: ____________ Start time: ____________ Doreatha Martin time:  ____________ Date: ____________ Movements: ____________ Start time: ____________ Doreatha Martin time: ____________ Date: ____________ Movements: ____________ Start time: ____________ Doreatha Martin time: ____________ Date: ____________ Movements: ____________ Start time: ____________ Doreatha Martin time: ____________  Date: ____________ Movements: ____________ Start time: ____________ Doreatha Martin time: ____________ Date: ____________ Movements: ____________ Start time: ____________ Doreatha Martin time: ____________ Date: ____________ Movements: ____________ Start time: ____________ Doreatha Martin time: ____________ Date: ____________ Movements: ____________ Start time: ____________ Doreatha Martin time: ____________ Date: ____________ Movements: ____________ Start time: ____________ Doreatha Martin time: ____________ Date: ____________ Movements: ____________ Start time: ____________ Doreatha Martin time: ____________ Date: ____________ Movements: ____________ Start time: ____________ Doreatha Martin time: ____________  Date: ____________ Movements: ____________ Start time: ____________ Doreatha Martin time: ____________ Date: ____________ Movements: ____________ Start time: ____________ Doreatha Martin time: ____________ Date: ____________ Movements: ____________ Start time: ____________ Doreatha Martin time: ____________ Date: ____________ Movements: ____________ Start time: ____________ Doreatha Martin time: ____________ Date: ____________ Movements: ____________ Start time: ____________ Doreatha Martin time: ____________ Date: ____________ Movements: ____________ Start time: ____________ Doreatha Martin time: ____________ Date: ____________ Movements: ____________ Start time: ____________ Doreatha Martin time: ____________  Date: ____________ Movements: ____________ Start time: ____________ Doreatha Martin time: ____________ Date: ____________ Movements:  ____________ Start time: ____________ Doreatha Martin time: ____________ Date: ____________ Movements: ____________ Start time: ____________ Doreatha Martin time: ____________ Date: ____________ Movements: ____________ Start time: ____________ Doreatha Martin time: ____________ Date: ____________ Movements: ____________ Start time: ____________ Doreatha Martin time: ____________ Date: ____________ Movements: ____________ Start time: ____________ Doreatha Martin time: ____________ Date: ____________ Movements: ____________ Start time: ____________ Doreatha Martin time: ____________  Date: ____________ Movements: ____________ Start time: ____________ Doreatha Martin time: ____________ Date: ____________ Movements: ____________ Start time: ____________ Doreatha Martin time: ____________ Date: ____________ Movements: ____________ Start time: ____________ Doreatha Martin time: ____________ Date: ____________ Movements: ____________ Start time: ____________ Doreatha Martin time: ____________ Date: ____________ Movements: ____________ Start time: ____________ Doreatha Martin time: ____________ Date: ____________ Movements: ____________ Start time: ____________ Doreatha Martin time: ____________ Date: ____________ Movements: ____________ Start time: ____________ Doreatha Martin time: ____________  Date: ____________ Movements: ____________ Start time: ____________ Doreatha Martin time: ____________ Date: ____________ Movements: ____________ Start time: ____________ Doreatha Martin time: ____________ Date: ____________ Movements: ____________ Start time: ____________ Doreatha Martin time: ____________ Date: ____________ Movements: ____________ Start time: ____________ Doreatha Martin time: ____________ Date: ____________ Movements: ____________ Start time: ____________ Doreatha Martin time: ____________ Date: ____________ Movements: ____________ Start time: ____________ Doreatha Martin time: ____________   This information is not intended to replace advice given to you by your health care provider. Make sure you discuss any questions you have with your health care  provider.   Document Released: 09/28/2006 Document Revised: 09/19/2014 Document Reviewed: 06/25/2012 Elsevier Interactive Patient Education 2016 [employer] of the uterus can occur throughout pregnancy. Contractions are not always a sign that you are in labor.  WHAT ARE BRAXTON HICKS CONTRACTIONS?  Contractions that occur before labor are called Braxton Hicks contractions, or false labor. Toward the end of pregnancy (32-34 weeks), these contractions can develop more often and may become more forceful. This is not true labor because these contractions do not result in opening (dilatation) and thinning of the cervix. They are sometimes difficult to tell apart from true labor because these contractions can be forceful and people have different pain tolerances. You should not feel embarrassed if you go to the hospital with false labor. Sometimes, the only way to tell if you are in true labor is for your health care provider to look for changes in the cervix. If there are no prenatal problems or other health problems associated with the pregnancy, it is completely safe to be sent home with false labor and await the onset of true labor. HOW  CAN YOU TELL THE DIFFERENCE BETWEEN TRUE AND FALSE LABOR? False Labor  The contractions of false labor are usually shorter and not as hard as those of true labor.   The contractions are usually irregular.   The contractions are often felt in the front of the lower abdomen and in the groin.   The contractions may go away when you walk around or change positions while lying down.   The contractions get weaker and are shorter lasting as time goes on.   The contractions do not usually become progressively stronger, regular, and closer together as with true labor.  True Labor  Contractions in true labor last 30-70 seconds, become very regular, usually become more intense, and increase in frequency.   The  contractions do not go away with walking.   The discomfort is usually felt in the top of the uterus and spreads to the lower abdomen and low back.   True labor can be determined by your health care provider with an exam. This will show that the cervix is dilating and getting thinner.  WHAT TO REMEMBER  Keep up with your usual exercises and follow other instructions given by your health care provider.   Take medicines as directed by your health care provider.   Keep your regular prenatal appointments.   Eat and drink lightly if you think you are going into labor.   If Braxton Hicks contractions are making you uncomfortable:   Change your position from lying down or resting to walking, or from walking to resting.   Sit and rest in a tub of warm water.   Drink 2-3 glasses of water. Dehydration may cause these contractions.   Do slow and deep breathing several times an hour.  WHEN SHOULD I SEEK IMMEDIATE MEDICAL CARE? Seek immediate medical care if:  Your contractions become stronger, more regular, and closer together.   You have fluid leaking or gushing from your vagina.   You have a fever.   You pass blood-tinged mucus.   You have vaginal bleeding.   You have continuous abdominal pain.   You have low back pain that you never had before.   You feel your baby's head pushing down and causing pelvic pressure.   Your baby is not moving as much as it used to.    This information is not intended to replace advice given to you by your health care provider. Make sure you discuss any questions you have with your health care provider.   Document Released: 08/29/2005 Document Revised: 09/03/2013 Document Reviewed: 06/10/2013 Elsevier Interactive Patient Education [employer]

## 2016-05-29 NOTE — Discharge Instructions (Signed)
Braxton Hicks Contractions °Contractions of the uterus can occur throughout pregnancy. Contractions are not always a sign that you are in labor.  °WHAT ARE BRAXTON HICKS CONTRACTIONS?  °Contractions that occur before labor are called Braxton Hicks contractions, or false labor. Toward the end of pregnancy (32-34 weeks), these contractions can develop more often and may become more forceful. This is not true labor because these contractions do not result in opening (dilatation) and thinning of the cervix. They are sometimes difficult to tell apart from true labor because these contractions can be forceful and people have different pain tolerances. You should not feel embarrassed if you go to the hospital with false labor. Sometimes, the only way to tell if you are in true labor is for your health care provider to look for changes in the cervix. °If there are no prenatal problems or other health problems associated with the pregnancy, it is completely safe to be sent home with false labor and await the onset of true labor. °HOW CAN YOU TELL THE DIFFERENCE BETWEEN TRUE AND FALSE LABOR? °False Labor °· The contractions of false labor are usually shorter and not as hard as those of true labor.   °· The contractions are usually irregular.   °· The contractions are often felt in the front of the lower abdomen and in the groin.   °· The contractions may go away when you walk around or change positions while lying down.   °· The contractions get weaker and are shorter lasting as time goes on.   °· The contractions do not usually become progressively stronger, regular, and closer together as with true labor.   °True Labor °· Contractions in true labor last 30-70 seconds, become very regular, usually become more intense, and increase in frequency.   °· The contractions do not go away with walking.   °· The discomfort is usually felt in the top of the uterus and spreads to the lower abdomen and low back.   °· True labor can be  determined by your health care provider with an exam. This will show that the cervix is dilating and getting thinner.   °WHAT TO REMEMBER °· Keep up with your usual exercises and follow other instructions given by your health care provider.   °· Take medicines as directed by your health care provider.   °· Keep your regular prenatal appointments.   °· Eat and drink lightly if you think you are going into labor.   °· If Braxton Hicks contractions are making you uncomfortable:   °¨ Change your position from lying down or resting to walking, or from walking to resting.   °¨ Sit and rest in a tub of warm water.   °¨ Drink 2-3 glasses of water. Dehydration may cause these contractions.   °¨ Do slow and deep breathing several times an hour.   °WHEN SHOULD I SEEK IMMEDIATE MEDICAL CARE? °Seek immediate medical care if: °· Your contractions become stronger, more regular, and closer together.   °· You have fluid leaking or gushing from your vagina.   °· You have a fever.   °· You pass blood-tinged mucus.   °· You have vaginal bleeding.   °· You have continuous abdominal pain.   °· You have low back pain that you never had before.   °· You feel your baby's head pushing down and causing pelvic pressure.   °· Your baby is not moving as much as it used to.   °  °This information is not intended to replace advice given to you by your health care provider. Make sure you discuss any questions you have with your health care   provider. °  °Document Released: 08/29/2005 Document Revised: 09/03/2013 Document Reviewed: 06/10/2013 °Elsevier Interactive Patient Education ©2016 Else[employer] Inc[employer] ° °

## 2016-05-29 NOTE — Progress Notes (Signed)
Providers notified of pt arrival to MAU.  Notified that pt came in complaining of irregular contractions.  Notified that pt states some contractions are 5 minutes apart and some contractions are back to back but they are always changing.  Provider notified that pt has come to MAU every day for the past three days complaining of contractions and her cervix is the same today as it was yesterday at 3, 70, -2, and vertex.  Provider states when pt's FHR tracing is reactive she can be discharged home and educated about labor precautions and when to come back to the hospital.

## 2016-05-30 ENCOUNTER — Inpatient Hospital Stay (HOSPITAL_COMMUNITY): Payer: Medicaid Other | Admitting: Anesthesiology

## 2016-05-30 ENCOUNTER — Inpatient Hospital Stay (HOSPITAL_COMMUNITY)
Admission: AD | Admit: 2016-05-30 | Discharge: 2016-06-04 | DRG: 765 | Disposition: A | Discharge status: Home / Self Care | Payer: Medicaid Other | Source: Ambulatory Visit | Attending: Family Medicine | Admitting: Family Medicine

## 2016-05-30 ENCOUNTER — Encounter (HOSPITAL_COMMUNITY): Payer: Self-pay | Admitting: *Deleted

## 2016-05-30 DIAGNOSIS — O99824 Streptococcus B carrier state complicating childbirth: Secondary | ICD-10-CM | POA: Diagnosis present

## 2016-05-30 DIAGNOSIS — Z8249 Family history of ischemic heart disease and other diseases of the circulatory system: Secondary | ICD-10-CM | POA: Diagnosis not present

## 2016-05-30 DIAGNOSIS — O9902 Anemia complicating childbirth: Secondary | ICD-10-CM | POA: Diagnosis present

## 2016-05-30 DIAGNOSIS — D649 Anemia, unspecified: Secondary | ICD-10-CM | POA: Diagnosis present

## 2016-05-30 DIAGNOSIS — IMO0001 Reserved for inherently not codable concepts without codable children: Secondary | ICD-10-CM

## 2016-05-30 DIAGNOSIS — O411230 Chorioamnionitis, third trimester, not applicable or unspecified: Secondary | ICD-10-CM | POA: Diagnosis present

## 2016-05-30 DIAGNOSIS — Z3403 Encounter for supervision of normal first pregnancy, third trimester: Secondary | ICD-10-CM | POA: Diagnosis present

## 2016-05-30 DIAGNOSIS — Z3A39 39 weeks gestation of pregnancy: Secondary | ICD-10-CM

## 2016-05-30 LAB — TYPE AND SCREEN
ABO/RH(D): O POS
Antibody Screen: NEGATIVE

## 2016-05-30 LAB — CBC
HCT: 39.3 % (ref 36.0–46.0)
Hemoglobin: 13.8 g/dL (ref 12.0–15.0)
MCH: 32 pg (ref 26.0–34.0)
MCHC: 35.1 g/dL (ref 30.0–36.0)
MCV: 91.2 fL (ref 78.0–100.0)
PLATELETS: 174 10*3/uL (ref 150–400)
RBC: 4.31 MIL/uL (ref 3.87–5.11)
RDW: 13.5 % (ref 11.5–15.5)
WBC: 8.6 10*3/uL (ref 4.0–10.5)

## 2016-05-30 LAB — ABO/RH: ABO/RH(D): O POS

## 2016-05-30 MED ORDER — SOD CITRATE-CITRIC ACID 500-334 MG/5ML PO SOLN
30.0000 mL | ORAL | Status: DC | PRN
  Filled 2016-05-30: qty 15

## 2016-05-30 MED ORDER — FLEET ENEMA 7-19 GM/118ML RE ENEM: 1.0000 | ENEMA | RECTAL | Status: DC | PRN

## 2016-05-30 MED ORDER — PENICILLIN G POTASSIUM 5000000 UNITS IJ SOLR
2.5000 10*6.[IU] | INTRAVENOUS | Status: DC
  Administered 2016-05-30 – 2016-05-31 (×4): 2.5 10*6.[IU] via INTRAVENOUS
  Filled 2016-05-30 (×8): qty 2.5

## 2016-05-30 MED ORDER — LIDOCAINE HCL (PF) 1 % IJ SOLN: 30.0000 mL | INTRAMUSCULAR | Status: DC | PRN

## 2016-05-30 MED ORDER — LACTATED RINGERS IV SOLN
500.0000 mL | INTRAVENOUS | Status: DC | PRN
  Administered 2016-05-31: 500 mL via INTRAVENOUS

## 2016-05-30 MED ORDER — ONDANSETRON HCL 4 MG/2ML IJ SOLN
4.0000 mg | Freq: Four times a day (QID) | INTRAMUSCULAR | Status: DC | PRN
  Filled 2016-05-30: qty 2

## 2016-05-30 MED ORDER — ACETAMINOPHEN 325 MG PO TABS: 650.0000 mg | ORAL_TABLET | ORAL | Status: DC | PRN

## 2016-05-30 MED ORDER — OXYCODONE-ACETAMINOPHEN 5-325 MG PO TABS: 2.0000 | ORAL_TABLET | ORAL | Status: DC | PRN

## 2016-05-30 MED ORDER — LACTATED RINGERS IV SOLN
500.0000 mL | Freq: Once | INTRAVENOUS | Status: AC
  Administered 2016-05-30: 500 mL via INTRAVENOUS

## 2016-05-30 MED ORDER — OXYTOCIN 40 UNITS IN LACTATED RINGERS INFUSION - SIMPLE MED: 2.5000 [IU]/h | INTRAVENOUS | Status: DC

## 2016-05-30 MED ORDER — PHENYLEPHRINE 40 MCG/ML (10ML) SYRINGE FOR IV PUSH (FOR BLOOD PRESSURE SUPPORT): 80.0000 ug | PREFILLED_SYRINGE | INTRAVENOUS | Status: DC | PRN

## 2016-05-30 MED ORDER — DIPHENHYDRAMINE HCL 50 MG/ML IJ SOLN: 12.5000 mg | INTRAMUSCULAR | Status: DC | PRN

## 2016-05-30 MED ORDER — LIDOCAINE HCL (PF) 1 % IJ SOLN
INTRAMUSCULAR | Status: DC | PRN
  Administered 2016-05-30 (×2): 4 mL

## 2016-05-30 MED ORDER — OXYCODONE-ACETAMINOPHEN 5-325 MG PO TABS: 1.0000 | ORAL_TABLET | ORAL | Status: DC | PRN

## 2016-05-30 MED ORDER — LACTATED RINGERS IV SOLN
INTRAVENOUS | Status: DC
  Administered 2016-05-30 – 2016-05-31 (×7): via INTRAVENOUS

## 2016-05-30 MED ORDER — PHENYLEPHRINE 40 MCG/ML (10ML) SYRINGE FOR IV PUSH (FOR BLOOD PRESSURE SUPPORT)
80.0000 ug | PREFILLED_SYRINGE | INTRAVENOUS | Status: DC | PRN
  Filled 2016-05-30: qty 10

## 2016-05-30 MED ORDER — FENTANYL CITRATE (PF) 100 MCG/2ML IJ SOLN: 50.0000 ug | INTRAMUSCULAR | Status: DC | PRN

## 2016-05-30 MED ORDER — EPHEDRINE 5 MG/ML INJ: 10.0000 mg | INTRAVENOUS | Status: DC | PRN

## 2016-05-30 MED ORDER — FENTANYL 2.5 MCG/ML BUPIVACAINE 1/10 % EPIDURAL INFUSION (WH - ANES)
14.0000 mL/h | INTRAMUSCULAR | Status: DC | PRN
  Administered 2016-05-30 – 2016-05-31 (×3): 14 mL/h via EPIDURAL
  Filled 2016-05-30 (×3): qty 125

## 2016-05-30 MED ORDER — OXYTOCIN BOLUS FROM INFUSION: 500.0000 mL | Freq: Once | INTRAVENOUS | Status: DC

## 2016-05-30 MED ORDER — PENICILLIN G POTASSIUM 5000000 UNITS IJ SOLR
5.0000 10*6.[IU] | Freq: Once | INTRAVENOUS | Status: AC
  Administered 2016-05-30: 5 10*6.[IU] via INTRAVENOUS
  Filled 2016-05-30: qty 5

## 2016-05-30 NOTE — Anesthesia Preprocedure Evaluation (Addendum)
Anesthesia Evaluation  Patient identified by MRN, date of birth, ID band Patient awake    Reviewed: Allergy & Precautions, NPO status , Patient's Chart, lab work & pertinent test results  History of Anesthesia Complications Negative for: history of anesthetic complications  Airway Mallampati: II  TM Distance: >3 FB Neck ROM: Full    Dental no notable dental hx. (+) Dental Advisory Given   Pulmonary neg pulmonary ROS,    Pulmonary exam normal breath sounds clear to auscultation       Cardiovascular negative cardio ROS Normal cardiovascular exam Rhythm:Regular Rate:Normal     Neuro/Psych negative neurological ROS  negative psych ROS   GI/Hepatic negative GI ROS, Neg liver ROS,   Endo/Other  negative endocrine ROS  Renal/GU negative Renal ROS  negative genitourinary   Musculoskeletal negative musculoskeletal ROS (+)   Abdominal   Peds negative pediatric ROS (+)  Hematology negative hematology ROS (+)   Anesthesia Other Findings   Reproductive/Obstetrics (+) Pregnancy                             Anesthesia Physical Anesthesia Plan  ASA: II  Anesthesia Plan: Epidural   Post-op Pain Management:    Induction:   Airway Management Planned:   Additional Equipment:   Intra-op Plan:   Post-operative Plan:   Informed Consent: I have reviewed the patients History and Physical, chart, labs and discussed the procedure including the risks, benefits and alternatives for the proposed anesthesia with the patient or authorized representative who has indicated his/her understanding and acceptance.   Dental advisory given  Plan Discussed with: CRNA  Anesthesia Plan Comments: (1720: C/S called. Epidural has been working well. Discussed using epidural for C/S with patient. She consents. Discussed GA backup.)       Anesthesia Quick Evaluation

## 2016-05-30 NOTE — H&P (Signed)
LABOR AND DELIVERY ADMISSION HISTORY AND PHYSICAL NOTE  Grace Joseph is a 25 y.o. female G1P0 with IUP at [redacted]w[redacted]d by LMP and 12 wk [place] presenting for SOL.   She reports contractions since Thursday that worsened this AM.  She reports positive fetal movement. She denies leakage of fluid or vaginal bleeding.  Prenatal History/Complications:  Past Medical History: Past Medical History:  Diagnosis Date  . Medical history non-contributory     Past Surgical History: Past Surgical History:  Procedure Laterality Date  . NO PAST SURGERIES      Obstetrical History: OB History    Gravida Para Term Preterm AB Living   1             SAB TAB Ectopic Multiple Live Births                  Social History: Social History   Social History  . Marital status: Single    Spouse name: N/A  . Number of children: N/A  . Years of education: N/A   Social History Main Topics  . Smoking status: Never Smoker  . Smokeless tobacco: Never Used  . Alcohol use Yes  . Drug use: No  . Sexual activity: Not Asked   Other Topics Concern  . None   Social History Narrative  . None    Family History: Family History  Problem Relation Age of Onset  . Hypertension Mother     Allergies: No Known Allergies  Prescriptions Prior to Admission  Medication Sig Dispense Refill Last Dose  . acetaminophen (TYLENOL) 500 MG tablet Take 1,500 mg by mouth every 6 (six) hours as needed (sore throat.). Reported on 11/27/2015   05/29/2016 at Unknown time  . diphenhydrAMINE (BENADRYL ALLERGY) 25 mg capsule Take 25 mg by mouth every 6 (six) hours as needed.   Past Week at Unknown time  . Prenatal Vit-Fe Fumarate-FA (PRENATAL MULTIVITAMIN) TABS tablet Take 1 tablet by mouth daily at 12 noon.   05/29/2016 at Unknown time     Review of Systems   All systems reviewed and negative except as stated in HPI  Blood pressure 119/76, pulse 94, temperature 98.3 F (36.8 C), temperature source Oral, resp. rate 18, last menstrual  period 08/26/2015. General appearance: alert Lungs: clear to auscultation bilaterally Heart: regular rate and rhythm Abdomen: soft, non-tender; bowel sounds normal Extremities: No calf swelling or tenderness Presentation: cephalic by cervical check Fetal monitoring: category 1 Uterine activity: contractions every 2-3 mintues Dilation: 4 Effacement (%): 100 Station: -2 Exam by:: Dr. Genevie Joseph   Prenatal labs: ABO, Rh:   o+ Antibody:   neg Rubella: immune RPR:   neg HBsAg:   neg HIV:   neg GBS: Positive (08/30 0000)  1 hr Glucola: abnormal, 3 hour normal Genetic screening:  Normal first trimester screen Anatomy US: normal.  Prenatal Transfer Tool  Maternal Diabetes: No Genetic Screening: Normal Maternal Ultrasounds/Referrals: Normal Fetal Ultrasounds or other Referrals:  None Maternal Substance Abuse:  No Significant Maternal Medications:  None Significant Maternal Lab Results: Lab values include: Group B Strep positive  No results found for this or any previous visit (from the past 24 hour(s)).  There are no active problems to display for this patient.   Assessment: Grace Joseph is a 25 y.o. G1P0 at [redacted]w[redacted]d here for SOL  #Labor:pt in early labor, expectant managment #Pain: does want epidural, IV pain medicine ordered as well. #FWB: Category 1 #ID:  GBS + start PCN #MOF: breast #MOC:OCP's #Circ:  Out patient.   Grace Joseph 05/30/2016, 3:38 PM

## 2016-05-30 NOTE — MAU Note (Signed)
Contractions. States was seen in MAU last night.

## 2016-05-30 NOTE — Progress Notes (Signed)
LABOR PROGRESS NOTE  Grace Joseph is a 25 y.o. G1P0 at 3171w5d  admitted for SOL.  Subjective: Doing well. Pain better controlled since getting epdiural  Objective: BP 123/78   Pulse (!) 108   Temp 98.4 F (36.9 C)   Resp 18   Ht 5\' 4"  (1.626 m)   Wt 182 lb (82.6 kg)   LMP 08/26/2015   SpO2 100%   BMI 31.24 kg/m  or  Vitals:   05/30/16 2201 05/30/16 2203 05/30/16 2205 05/30/16 2206  BP: 118/74 118/74  123/78  Pulse: 89 89 86 (!) 108  Resp:      Temp:      TempSrc:      SpO2:  100% 100% 100%  Weight:      Height:        Dilation: 4.5 Effacement (%): 90 Cervical Position: Anterior Station: -2 Presentation: Vertex Exam by:: Dr. Nira Retortdegele  FHT: baseline rate 140, moderate variability, +acel, no decel  Labs: Lab Results  Component Value Date   WBC 8.6 05/30/2016   HGB 13.8 05/30/2016   HCT 39.3 05/30/2016   MCV 91.2 05/30/2016   PLT 174 05/30/2016    Patient Active Problem List   Diagnosis Date Noted  . Active labor 05/30/2016  . Active labor at term 05/30/2016    Assessment / Plan: 25 y.o. G1P0 at 5271w5d here for SOL  Labor: Expectant maangement. Likely AROM once more cervical change Fetal Wellbeing:  Catergory I Pain Control:  Well-controlled with epidural Anticipated MOD:  SVD  Frederik PearJulie P Degele, MD 05/30/2016, 10:09 PM\

## 2016-05-30 NOTE — Anesthesia Pain Management Evaluation Note (Signed)
2 CRNA Pain Management Visit Note  Patient: Grace Joseph, 25 y.o., female  "Hello I am a member of the anesthesia team at [facility]. We have an anesthesia team available at all times to provide care throughout the hospital, including epidural management and anesthesia for C-section. I don't know your plan for the delivery whether it a natural birth, water birth, IV sedation, nitrous supplementation, doula or epidural, but we want to meet your pain goals."   1.Was your pain managed to your expectations on prior hospitalizations?   No prior hospitalizations  2.What is your expectation for pain management during this hospitalization?     Epidural and Nitrous Oxide  3.How can we help you reach that goal? epidural  Record the patient's initial score and the patient's pain goal.   Pain: 2  Pain Goal: 7 [facility] wants you to be able to say your pain was always managed very well.  Grace Joseph 05/30/2016

## 2016-05-30 NOTE — Anesthesia Procedure Notes (Signed)
Epidural Patient location during procedure: OB  Staffing Anesthesiologist: Khadeeja Elden Performed: anesthesiologist   Preanesthetic Checklist Completed: patient identified, site marked, surgical consent, pre-op evaluation, timeout performed, IV checked, risks and benefits discussed and monitors and equipment checked  Epidural Patient position: sitting Prep: site prepped and draped and DuraPrep Patient monitoring: continuous pulse ox and blood pressure Approach: midline Location: L3-L4 Injection technique: LOR saline  Needle:  Needle type: Tuohy  Needle gauge: 17 G Needle length: 9 cm and 9 Needle insertion depth: 5 cm cm Catheter type: closed end flexible Catheter size: 19 Gauge Catheter at skin depth: 10 cm Test dose: negative  Assessment Events: blood not aspirated, injection not painful, no injection resistance, negative IV test and no paresthesia  Additional Notes Patient identified. Risks/Benefits/Options discussed with patient including but not limited to bleeding, infection, nerve damage, paralysis, failed block, incomplete pain control, headache, blood pressure changes, nausea, vomiting, reactions to medication both or allergic, itching and postpartum back pain. Confirmed with bedside nurse the patient's most recent platelet count. Confirmed with patient that they are not currently taking any anticoagulation, have any bleeding history or any family history of bleeding disorders. Patient expressed understanding and wished to proceed. All questions were answered. Sterile technique was used throughout the entire procedure. Please see nursing notes for vital signs. Test dose was given through epidural catheter and negative prior to continuing to dose epidural or start infusion. Warning signs of high block given to the patient including shortness of breath, tingling/numbness in hands, complete motor block, or any concerning symptoms with instructions to call for help. Patient was  given instructions on fall risk and not to get out of bed. All questions and concerns addressed with instructions to call with any issues or inadequate analgesia.        

## 2016-05-31 ENCOUNTER — Encounter (HOSPITAL_COMMUNITY)
Admission: AD | Disposition: A | Discharge status: Home / Self Care | Payer: Self-pay | Source: Ambulatory Visit | Attending: Family Medicine

## 2016-05-31 ENCOUNTER — Encounter (HOSPITAL_COMMUNITY): Payer: Self-pay | Admitting: Anesthesiology

## 2016-05-31 DIAGNOSIS — O411230 Chorioamnionitis, third trimester, not applicable or unspecified: Secondary | ICD-10-CM

## 2016-05-31 DIAGNOSIS — Z3A39 39 weeks gestation of pregnancy: Secondary | ICD-10-CM

## 2016-05-31 DIAGNOSIS — O9902 Anemia complicating childbirth: Secondary | ICD-10-CM

## 2016-05-31 DIAGNOSIS — O99824 Streptococcus B carrier state complicating childbirth: Secondary | ICD-10-CM

## 2016-05-31 LAB — RPR: RPR: NONREACTIVE

## 2016-05-31 SURGERY — Surgical Case
Anesthesia: Epidural

## 2016-05-31 MED ORDER — MORPHINE SULFATE (PF) 10 MG/ML IV SOLN
INTRAVENOUS | Status: DC | PRN
  Administered 2016-05-31: 2 mg via INTRAVENOUS

## 2016-05-31 MED ORDER — MORPHINE SULFATE (PF) 0.5 MG/ML IJ SOLN
INTRAMUSCULAR | Status: AC
  Filled 2016-05-31: qty 10

## 2016-05-31 MED ORDER — DIPHENHYDRAMINE HCL 25 MG PO CAPS: 25.0000 mg | ORAL_CAPSULE | Freq: Four times a day (QID) | ORAL | Status: DC | PRN

## 2016-05-31 MED ORDER — SCOPOLAMINE 1 MG/3DAYS TD PT72
MEDICATED_PATCH | TRANSDERMAL | Status: DC | PRN
  Administered 2016-05-31: 1 via TRANSDERMAL

## 2016-05-31 MED ORDER — ATROPINE SULFATE 0.4 MG/ML IJ SOLN
INTRAMUSCULAR | Status: AC
  Filled 2016-05-31: qty 1

## 2016-05-31 MED ORDER — OXYTOCIN 10 UNIT/ML IJ SOLN
INTRAVENOUS | Status: DC | PRN
  Administered 2016-05-31: 40 [IU] via INTRAVENOUS

## 2016-05-31 MED ORDER — LACTATED RINGERS IV SOLN
INTRAVENOUS | Status: DC
  Administered 2016-05-31: 23:00:00 via INTRAVENOUS

## 2016-05-31 MED ORDER — PHENYLEPHRINE 40 MCG/ML (10ML) SYRINGE FOR IV PUSH (FOR BLOOD PRESSURE SUPPORT)
PREFILLED_SYRINGE | INTRAVENOUS | Status: AC
  Filled 2016-05-31: qty 10

## 2016-05-31 MED ORDER — SCOPOLAMINE 1 MG/3DAYS TD PT72
MEDICATED_PATCH | TRANSDERMAL | Status: AC
  Filled 2016-05-31: qty 1

## 2016-05-31 MED ORDER — WITCH HAZEL-GLYCERIN EX PADS: 1.0000 "application " | MEDICATED_PAD | CUTANEOUS | Status: DC | PRN

## 2016-05-31 MED ORDER — AMPICILLIN SODIUM 2 G IJ SOLR
2.0000 g | Freq: Four times a day (QID) | INTRAMUSCULAR | Status: AC
  Administered 2016-05-31: 2 g via INTRAVENOUS
  Filled 2016-05-31: qty 2000

## 2016-05-31 MED ORDER — LIDOCAINE-EPINEPHRINE (PF) 2 %-1:200000 IJ SOLN
INTRAMUSCULAR | Status: AC
  Filled 2016-05-31: qty 20

## 2016-05-31 MED ORDER — SIMETHICONE 80 MG PO CHEW: 80.0000 mg | CHEWABLE_TABLET | ORAL | Status: DC | PRN

## 2016-05-31 MED ORDER — OXYTOCIN 40 UNITS IN LACTATED RINGERS INFUSION - SIMPLE MED
1.0000 m[IU]/min | INTRAVENOUS | Status: DC
  Administered 2016-05-31: 2 m[IU]/min via INTRAVENOUS
  Filled 2016-05-31: qty 1000

## 2016-05-31 MED ORDER — DIPHENHYDRAMINE HCL 50 MG/ML IJ SOLN: 12.5000 mg | INTRAMUSCULAR | Status: DC | PRN

## 2016-05-31 MED ORDER — SODIUM CHLORIDE 0.9 % IR SOLN
Status: DC | PRN
  Administered 2016-05-31: 1000 mL

## 2016-05-31 MED ORDER — SODIUM CHLORIDE 0.9 % IV SOLN
2.0000 g | Freq: Four times a day (QID) | INTRAVENOUS | Status: DC
  Administered 2016-05-31: 2 g via INTRAVENOUS
  Filled 2016-05-31 (×3): qty 2000

## 2016-05-31 MED ORDER — ACETAMINOPHEN 500 MG PO TABS
1000.0000 mg | ORAL_TABLET | Freq: Once | ORAL | Status: AC
  Administered 2016-05-31: 1000 mg via ORAL
  Filled 2016-05-31: qty 2

## 2016-05-31 MED ORDER — GENTAMICIN SULFATE 40 MG/ML IJ SOLN
150.0000 mg | Freq: Three times a day (TID) | INTRAMUSCULAR | Status: DC
  Administered 2016-05-31: 150 mg via INTRAVENOUS
  Filled 2016-05-31 (×2): qty 3.75

## 2016-05-31 MED ORDER — ONDANSETRON HCL 4 MG/2ML IJ SOLN
INTRAMUSCULAR | Status: AC
  Filled 2016-05-31: qty 2

## 2016-05-31 MED ORDER — METHYLERGONOVINE MALEATE 0.2 MG/ML IJ SOLN
INTRAMUSCULAR | Status: AC
  Filled 2016-05-31: qty 1

## 2016-05-31 MED ORDER — TETANUS-DIPHTH-ACELL PERTUSSIS 5-2.5-18.5 LF-MCG/0.5 IM SUSP: 0.5000 mL | Freq: Once | INTRAMUSCULAR | Status: DC

## 2016-05-31 MED ORDER — FENTANYL CITRATE (PF) 100 MCG/2ML IJ SOLN
INTRAMUSCULAR | Status: DC | PRN
  Administered 2016-05-31 (×2): 50 ug via INTRAVENOUS

## 2016-05-31 MED ORDER — SCOPOLAMINE 1 MG/3DAYS TD PT72: 1.0000 | MEDICATED_PATCH | Freq: Once | TRANSDERMAL | Status: DC

## 2016-05-31 MED ORDER — DIBUCAINE 1 % RE OINT: 1.0000 "application " | TOPICAL_OINTMENT | RECTAL | Status: DC | PRN

## 2016-05-31 MED ORDER — FENTANYL CITRATE (PF) 100 MCG/2ML IJ SOLN
INTRAMUSCULAR | Status: AC
  Filled 2016-05-31: qty 2

## 2016-05-31 MED ORDER — OXYCODONE HCL 5 MG PO TABS
10.0000 mg | ORAL_TABLET | ORAL | Status: DC | PRN
  Administered 2016-06-03 – 2016-06-04 (×3): 10 mg via ORAL
  Filled 2016-05-31 (×3): qty 2

## 2016-05-31 MED ORDER — TERBUTALINE SULFATE 1 MG/ML IJ SOLN: 0.2500 mg | Freq: Once | INTRAMUSCULAR | Status: DC | PRN

## 2016-05-31 MED ORDER — NALBUPHINE HCL 10 MG/ML IJ SOLN: 5.0000 mg | INTRAMUSCULAR | Status: DC | PRN

## 2016-05-31 MED ORDER — SUCCINYLCHOLINE CHLORIDE 200 MG/10ML IV SOSY
PREFILLED_SYRINGE | INTRAVENOUS | Status: AC
  Filled 2016-05-31: qty 10

## 2016-05-31 MED ORDER — METHYLERGONOVINE MALEATE 0.2 MG/ML IJ SOLN
INTRAMUSCULAR | Status: DC | PRN
Start: 2016-05-31 — End: 2016-05-31
  Administered 2016-05-31: 0.2 mg via INTRAMUSCULAR

## 2016-05-31 MED ORDER — SIMETHICONE 80 MG PO CHEW
80.0000 mg | CHEWABLE_TABLET | Freq: Three times a day (TID) | ORAL | Status: DC
  Administered 2016-06-01 – 2016-06-04 (×9): 80 mg via ORAL
  Filled 2016-05-31 (×10): qty 1

## 2016-05-31 MED ORDER — PHENYLEPHRINE 8 MG IN D5W 100 ML (0.08MG/ML) PREMIX OPTIME
INJECTION | INTRAVENOUS | Status: AC
  Filled 2016-05-31: qty 100

## 2016-05-31 MED ORDER — PROPOFOL 10 MG/ML IV BOLUS
INTRAVENOUS | Status: AC
  Filled 2016-05-31: qty 20

## 2016-05-31 MED ORDER — INFLUENZA VAC SPLIT QUAD 0.5 ML IM SUSY
0.5000 mL | PREFILLED_SYRINGE | INTRAMUSCULAR | Status: AC | PRN
  Administered 2016-06-04: 0.5 mL via INTRAMUSCULAR
  Filled 2016-05-31: qty 0.5

## 2016-05-31 MED ORDER — ONDANSETRON HCL 4 MG/2ML IJ SOLN
INTRAMUSCULAR | Status: DC | PRN
  Administered 2016-05-31: 4 mg via INTRAVENOUS

## 2016-05-31 MED ORDER — ONDANSETRON HCL 4 MG/2ML IJ SOLN
4.0000 mg | Freq: Three times a day (TID) | INTRAMUSCULAR | Status: DC | PRN
Start: 2016-05-31 — End: 2016-06-04

## 2016-05-31 MED ORDER — SIMETHICONE 80 MG PO CHEW
80.0000 mg | CHEWABLE_TABLET | ORAL | Status: DC
  Administered 2016-06-01 – 2016-06-03 (×2): 80 mg via ORAL
  Filled 2016-05-31 (×3): qty 1

## 2016-05-31 MED ORDER — NALBUPHINE HCL 10 MG/ML IJ SOLN: 5.0000 mg | Freq: Once | INTRAMUSCULAR | Status: DC | PRN

## 2016-05-31 MED ORDER — IBUPROFEN 600 MG PO TABS
600.0000 mg | ORAL_TABLET | Freq: Four times a day (QID) | ORAL | Status: DC
  Administered 2016-05-31 – 2016-06-03 (×10): 600 mg via ORAL
  Filled 2016-05-31 (×10): qty 1

## 2016-05-31 MED ORDER — SENNOSIDES-DOCUSATE SODIUM 8.6-50 MG PO TABS
2.0000 | ORAL_TABLET | ORAL | Status: DC
  Administered 2016-05-31 – 2016-06-03 (×3): 2 via ORAL
  Filled 2016-05-31 (×3): qty 2

## 2016-05-31 MED ORDER — PHENYLEPHRINE HCL 10 MG/ML IJ SOLN
INTRAMUSCULAR | Status: DC | PRN
  Administered 2016-05-31 (×2): 80 ug via INTRAVENOUS
  Administered 2016-05-31: 40 ug via INTRAVENOUS

## 2016-05-31 MED ORDER — ZOLPIDEM TARTRATE 5 MG PO TABS: 5.0000 mg | ORAL_TABLET | Freq: Every evening | ORAL | Status: DC | PRN

## 2016-05-31 MED ORDER — NALOXONE HCL 2 MG/2ML IJ SOSY: 1.0000 ug/kg/h | PREFILLED_SYRINGE | INTRAVENOUS | Status: DC | PRN

## 2016-05-31 MED ORDER — GENTAMICIN SULFATE 40 MG/ML IJ SOLN
150.0000 mg | Freq: Three times a day (TID) | INTRAVENOUS | Status: AC
  Administered 2016-05-31: 150 mg via INTRAVENOUS
  Filled 2016-05-31: qty 3.75

## 2016-05-31 MED ORDER — ACETAMINOPHEN 325 MG PO TABS
650.0000 mg | ORAL_TABLET | ORAL | Status: DC | PRN
  Administered 2016-06-03 (×3): 650 mg via ORAL
  Filled 2016-05-31 (×3): qty 2

## 2016-05-31 MED ORDER — LACTATED RINGERS IV SOLN
INTRAVENOUS | Status: DC | PRN
  Administered 2016-05-31 (×3): via INTRAVENOUS

## 2016-05-31 MED ORDER — MENTHOL 3 MG MT LOZG: 1.0000 | LOZENGE | OROMUCOSAL | Status: DC | PRN

## 2016-05-31 MED ORDER — MEPERIDINE HCL 25 MG/ML IJ SOLN: 6.2500 mg | INTRAMUSCULAR | Status: DC | PRN

## 2016-05-31 MED ORDER — PRENATAL MULTIVITAMIN CH
1.0000 | ORAL_TABLET | Freq: Every day | ORAL | Status: DC
  Administered 2016-06-01 – 2016-06-03 (×3): 1 via ORAL
  Filled 2016-05-31 (×4): qty 1

## 2016-05-31 MED ORDER — OXYTOCIN 10 UNIT/ML IJ SOLN
INTRAMUSCULAR | Status: AC
Start: 2016-05-31 — End: 2016-05-31
  Filled 2016-05-31: qty 4

## 2016-05-31 MED ORDER — CEFAZOLIN SODIUM-DEXTROSE 2-4 GM/100ML-% IV SOLN: 2.0000 g | INTRAVENOUS | Status: DC

## 2016-05-31 MED ORDER — OXYTOCIN 40 UNITS IN LACTATED RINGERS INFUSION - SIMPLE MED: 2.5000 [IU]/h | INTRAVENOUS | Status: AC

## 2016-05-31 MED ORDER — DEXAMETHASONE SODIUM PHOSPHATE 4 MG/ML IJ SOLN
INTRAMUSCULAR | Status: DC | PRN
  Administered 2016-05-31: 4 mg via INTRAVENOUS

## 2016-05-31 MED ORDER — ACETAMINOPHEN 500 MG PO TABS
1000.0000 mg | ORAL_TABLET | Freq: Four times a day (QID) | ORAL | Status: AC
  Administered 2016-05-31 – 2016-06-01 (×4): 1000 mg via ORAL
  Filled 2016-05-31 (×4): qty 2

## 2016-05-31 MED ORDER — DEXAMETHASONE SODIUM PHOSPHATE 4 MG/ML IJ SOLN
INTRAMUSCULAR | Status: AC
  Filled 2016-05-31: qty 1

## 2016-05-31 MED ORDER — DIPHENHYDRAMINE HCL 25 MG PO CAPS: 25.0000 mg | ORAL_CAPSULE | ORAL | Status: DC | PRN

## 2016-05-31 MED ORDER — FENTANYL CITRATE (PF) 100 MCG/2ML IJ SOLN
25.0000 ug | INTRAMUSCULAR | Status: DC | PRN
  Administered 2016-05-31: 50 ug via INTRAVENOUS

## 2016-05-31 MED ORDER — SODIUM CHLORIDE 0.9% FLUSH
3.0000 mL | INTRAVENOUS | Status: DC | PRN
  Administered 2016-06-02 (×2): 3 mL via INTRAVENOUS
  Filled 2016-05-31 (×2): qty 3

## 2016-05-31 MED ORDER — NALBUPHINE HCL 10 MG/ML IJ SOLN
5.0000 mg | INTRAMUSCULAR | Status: DC | PRN
  Filled 2016-05-31: qty 1

## 2016-05-31 MED ORDER — CEFAZOLIN SODIUM-DEXTROSE 2-3 GM-% IV SOLR
INTRAVENOUS | Status: DC | PRN
  Administered 2016-05-31: 2 g via INTRAVENOUS

## 2016-05-31 MED ORDER — COCONUT OIL OIL: 1.0000 "application " | TOPICAL_OIL | Status: DC | PRN

## 2016-05-31 MED ORDER — MORPHINE SULFATE (PF) 0.5 MG/ML IJ SOLN
INTRAMUSCULAR | Status: DC | PRN
  Administered 2016-05-31: 3 mg via EPIDURAL

## 2016-05-31 MED ORDER — SODIUM BICARBONATE 8.4 % IV SOLN
INTRAVENOUS | Status: DC | PRN
  Administered 2016-05-31: 3 mL via EPIDURAL
  Administered 2016-05-31: 2 mL via EPIDURAL
  Administered 2016-05-31 (×2): 5 mL via EPIDURAL
  Administered 2016-05-31: 3 mL via EPIDURAL

## 2016-05-31 MED ORDER — METRONIDAZOLE IN NACL 5-0.79 MG/ML-% IV SOLN
500.0000 mg | Freq: Three times a day (TID) | INTRAVENOUS | Status: AC
Start: 2016-05-31 — End: 2016-06-01
  Administered 2016-05-31 – 2016-06-01 (×2): 500 mg via INTRAVENOUS
  Filled 2016-05-31 (×2): qty 100

## 2016-05-31 MED ORDER — NALOXONE HCL 0.4 MG/ML IJ SOLN: 0.4000 mg | INTRAMUSCULAR | Status: DC | PRN

## 2016-05-31 MED ORDER — OXYCODONE HCL 5 MG PO TABS
5.0000 mg | ORAL_TABLET | ORAL | Status: DC | PRN
  Administered 2016-06-03 (×4): 5 mg via ORAL
  Filled 2016-05-31 (×4): qty 1

## 2016-05-31 MED ORDER — SOD CITRATE-CITRIC ACID 500-334 MG/5ML PO SOLN
30.0000 mL | ORAL | Status: AC
  Administered 2016-05-31: 30 mL via ORAL

## 2016-05-31 MED ORDER — PROMETHAZINE HCL 25 MG/ML IJ SOLN
6.2500 mg | INTRAMUSCULAR | Status: DC | PRN
Start: 2016-05-31 — End: 2016-05-31

## 2016-05-31 SURGICAL SUPPLY — 31 items
BENZOIN TINCTURE PRP APPL 2/3 (GAUZE/BANDAGES/DRESSINGS) ×2 IMPLANT
CANISTER SUCT 3000ML PPV (MISCELLANEOUS) ×2 IMPLANT
CHLORAPREP W/TINT 26ML (MISCELLANEOUS) ×2 IMPLANT
CLOSURE STERI STRIP 1/2 X4 (GAUZE/BANDAGES/DRESSINGS) ×2 IMPLANT
DRSG OPSITE POSTOP 4X10 (GAUZE/BANDAGES/DRESSINGS) ×2 IMPLANT
ELECT REM PT RETURN 9FT ADLT (ELECTROSURGICAL) ×2
ELECTRODE REM PT RTRN 9FT ADLT (ELECTROSURGICAL) ×1 IMPLANT
GLOVE BIOGEL PI IND STRL 7.0 (GLOVE) ×2 IMPLANT
GLOVE BIOGEL PI IND STRL 7.5 (GLOVE) ×1 IMPLANT
GLOVE BIOGEL PI INDICATOR 7.0 (GLOVE) ×2
GLOVE BIOGEL PI INDICATOR 7.5 (GLOVE) ×1
GLOVE SKINSENSE NS SZ7.0 (GLOVE) ×1
GLOVE SKINSENSE STRL SZ7.0 (GLOVE) ×1 IMPLANT
GOWN STRL REUS W/ TWL LRG LVL3 (GOWN DISPOSABLE) ×2 IMPLANT
GOWN STRL REUS W/ TWL XL LVL3 (GOWN DISPOSABLE) ×1 IMPLANT
GOWN STRL REUS W/TWL LRG LVL3 (GOWN DISPOSABLE) ×2
GOWN STRL REUS W/TWL XL LVL3 (GOWN DISPOSABLE) ×1
LIQUID BAND (GAUZE/BANDAGES/DRESSINGS) ×2 IMPLANT
NS IRRIG 1000ML POUR BTL (IV SOLUTION) ×2 IMPLANT
PACK C SECTION WH (CUSTOM PROCEDURE TRAY) ×2 IMPLANT
PAD OB MATERNITY 4.3X12.25 (PERSONAL CARE ITEMS) ×2 IMPLANT
PAD PREP 24X48 CUFFED NSTRL (MISCELLANEOUS) ×2 IMPLANT
SPONGE LAP 18X18 X RAY DECT (DISPOSABLE) ×8 IMPLANT
SUT CHROMIC 1 CTX 36 (SUTURE) ×2 IMPLANT
SUT MON AB 4-0 PS1 27 (SUTURE) ×2 IMPLANT
SUT MON AB-0 CT1 36 (SUTURE) ×6 IMPLANT
SUT PLAIN 2 0 (SUTURE) ×1
SUT PLAIN ABS 2-0 CT1 27XMFL (SUTURE) ×1 IMPLANT
SUT VIC AB 0 CT1 36 (SUTURE) ×4 IMPLANT
SUT VIC AB 3-0 CT1 27 (SUTURE) ×1
SUT VIC AB 3-0 CT1 TAPERPNT 27 (SUTURE) ×1 IMPLANT

## 2016-05-31 NOTE — Progress Notes (Signed)
ANTIBIOTIC CONSULT NOTE - INITIAL  Pharmacy Consult for Gentamicin Indication: Chorioamnionitis   No Known Allergies  Patient Measurements: Height: 5\' 4"  (162.6 cm) Weight: 182 lb (82.6 kg) IBW/kg (Calculated) : 54.7 Adjusted Body Weight: 63.1kg  Vital Signs: Temp: 100.9 F (38.3 C) (09/19 1245) Temp Source: Axillary (09/19 1245) BP: 121/91 (09/19 0731) Pulse Rate: 92 (09/19 0701)  Labs:  Recent Labs  05/30/16 1600  WBC 8.6  HGB 13.8  PLT 174   No results for input(s): GENTTROUGH, GENTPEAK, GENTRANDOM in the last 72 hours.   Microbiology: Recent Results (from the past 720 hour(s))  OB RESULT CONSOLE Group B Strep     Status: None   Collection Time: 05/11/16 12:00 AM  Result Value Ref Range Status   GBS Positive  Final    Medications:  Ampicillin 2g IV q6hrs  Assessment: 25 y.o. female G1P0 at 5324w6d on ampicillin and gentamicin for chorio.  Estimated Ke = 0.282, Vd = 25.2L  Goal of Therapy:  Gentamicin peak 6-8 mg/L and Trough < 1 mg/L  Plan:   Gentamicin 150mg  IV every 8 hrs  Check Scr with next labs if gentamicin continued. Will check gentamicin levels if continued > 72hr or clinically indicated.   Wendie Simmerynthia Deloria Brassfield, PharmD, BCPS Clinical Pharmacist

## 2016-05-31 NOTE — Anesthesia Rounding Note (Signed)
  CRNA Epidural Rounding Note  Patient: Grace Joseph, 25 y.o., female  Patient's current pain level: 0  Agreed upon pain management level: 4  Epidural intervention: No   Comments: patient comfortable  [facility],[name] 05/31/2016

## 2016-05-31 NOTE — Transfer of Care (Signed)
Immediate Anesthesia Transfer of Care Note  Patient: Grace Joseph  Procedure(s) Performed: Procedure(s): CESAREAN SECTION (N/A)  Patient Location: PACU  Anesthesia Type:Epidural  Level of Consciousness: awake, alert , oriented and patient cooperative  Airway & Oxygen Therapy: Patient Spontanous Breathing  Post-op Assessment: Report given to RN and Post -op Vital signs reviewed and stable  Post vital signs: Reviewed and stable  Last Vitals:  TEMP 98.4 BP 107/82 HR 126 RR 20 POX 100  Last Pain: 3 Pain goal: 4       Complications: No apparent anesthesia complications

## 2016-05-31 NOTE — Progress Notes (Signed)
Patient ID: Grace Joseph, female   DOB: 04/19/1991, 25 y.o.   MRN: [id number] LABOR PROGRESS NOTE  Grace CullLeah Brecheisen is a 25 y.o. G1P0 at 4062w6d  admitted for SOL  Subjective: Feeling okay  Objective: BP 102/82   Pulse (!) 123   Temp 98.1 F (36.7 C) (Oral)   Resp 18   Ht 5\' 4"  (1.626 m)   Wt 82.6 kg (182 lb)   LMP 08/26/2015   SpO2 100%   BMI 31.24 kg/m  or  Vitals:   05/31/16 1531 05/31/16 1559 05/31/16 1601 05/31/16 1612  BP: 116/76  102/82   Pulse: 99  (!) 123   Resp: 18  18 18   Temp:  98.1 F (36.7 C)    TempSrc:  Oral    SpO2:      Weight:      Height:         Dilation: 7 Effacement (%): 90 Cervical Position: Anterior Station: 0 Presentation: Vertex Exam by:: Dr Artist PaisYoo FHT: 170 baseline, minimal variability, no accelerations, prolonged and late decelerations. Responsive to scalp stimulation Uterine activity: q2-864min  Labs: Lab Results  Component Value Date   WBC 8.6 05/30/2016   HGB 13.8 05/30/2016   HCT 39.3 05/30/2016   MCV 91.2 05/30/2016   PLT 174 05/30/2016    Patient Active Problem List   Diagnosis Date Noted  . Active labor 05/30/2016  . Active labor at term 05/30/2016    Assessment / Plan: 25 y.o. G1P0 at 3262w6d here for SOL  Labor: turned off pitocin, will likely need caesarian section for NRFHT Fetal Wellbeing:  Category III Pain Control:  epidural Anticipated MOD:  c-section  Leland HerElsia J Gabryelle Whitmoyer, DO PGY-1 9/19/20174:27 PM

## 2016-05-31 NOTE — Progress Notes (Signed)
Patient ID: Gershon CullLeah Sellman, female   DOB: 02/26/1991, 25 y.o.   MRN: [id number]   **LATE ENTRY**   S: Patient seen and examined for progress of labor and fetal tachycardia. Patient comfortable in the room.   O:  Vitals:   05/31/16 1559 05/31/16 1601 05/31/16 1612 05/31/16 1630  BP:  102/82  118/70  Pulse:  (!) 123  93  Resp:  18 18   Temp: 98.1 F (36.7 C)     TempSrc: Oral     SpO2:      Weight:      Height:        Dilation: 7 Effacement (%): 90 Cervical Position: Anterior Station: 0 Presentation: Vertex Exam by:: Dr Artist PaisYoo  FHT: 160, min to mod var, +accels, no decels TOCO: q4-6 min, pitocin has been turned off  Patient felt warm, had an axillary temperature of 100.31F, upon SVE, foul odor was noticed when more clear fluid returned.  A/P:  25 y.o. G1P0 5313w6d here for SOL with augmentation of pitocin and AROM at 02:00 this AM.  Triple I  Start Amp/Gent IVF bolus Tylenol 1000mg  once Restart pitocin after IVF bolus and antibiotics started

## 2016-05-31 NOTE — Op Note (Signed)
Operative Note   SURGERY DATE: 05/31/2016  PRE-OP DIAGNOSIS:  Pregnancy @ 39 6/7 weeks Arrest of dilation at 7cm Chorioamionitis  POST-OP DIAGNOSIS: Same. Postpartum hemorrhage. Delivered  PROCEDURE: Primary low transverse cesarean section via pfannenstiel skin incision with double layer uterine closure  SURGEON: Surgeon(s) and Role:    * Dubois Bing, MD - Primary  ASSISTANT: Jen Mow, DO  ANESTHESIA: epidural  ESTIMATED BLOOD LOSS: 1300 cc  DRAINS: UOP via indwelling foley  TOTAL IV FLUIDS: crystalloid  VTE Prophylaxis: SCDs  ANTIBIOTICS: 2 grams Cefazolin and 500 mg Flagyl were given, within 1 hour of skin incision  SPECIMENS: None  COMPLICATIONS: None  INDICATIONS: Arrest of dilation  FINDINGS: No intra-abdominal adhesions were noted. Grossly normal uterus, tubes and ovaries. Clear amniotic fluid, Cephalic OA presentation, female infant, weight 3115g, APGARs 2/9, intact placenta. Narrow arch to pelvis with large caput.Marland Kitchen  PROCEDURE IN DETAIL: The patient was taken to the operating room where anesthesia was administered and normal fetal heart tones were confirmed. She was then prepped and draped in the normal fashion in the dorsal supine position with a leftward tilt.  After a time out was performed, a pfannensteil low transverse skin incision was made with the scalpel and carried through to the underlying layer of fascia. The fascia was then incised at the midline and this incision was extended laterally with the mayo scissors. Attention was turned to the superior aspect of the fascial incision which was grasped with the kocher clamps x 2, tented up and the rectus muscles were dissected off with the Bovie. In a similar fashion the inferior aspect of the fascial incision was grasped with the kocher clamps, tented up and the rectus muscles dissected off with the mayo scissors. The rectus muscles were then separated in the midline and the peritoneum was  entered bluntly. The bladder blade was inserted and the vesicouterine peritoneum was identified, tented up and entered with the metzenbaum scissors. This incision was extended laterally and the bladder flap was created digitally. The bladder blade was reinserted.  A low transverse hysterotomy was made with the scalpel until the endometrial cavity was breached and the amniotic sac ruptured, yielding clear amniotic fluid. This incision was extended bluntly and the infant's head, shoulders and body were delivered atraumatically.The cord was clamped x 2 and cut, and the infant was handed to the awaiting pediatricians, after delayed cord clamping was done for 30 seconds.  The placenta was then gradually expressed from the uterus and then the uterus was exteriorized and cleared of all clots and debris. The hysterotomy was repaired with a running suture of 1-0 Monocryl. A second imbricating layer of 1-0 Monocryl suture was then placed. An O'Leary stitch was placed 1-0 Chromic suture on the left side of incision (above and below). Several figure-of-eight sutures of 1-0 Monocryl were added to achieve excellent hemostasis.    The uterus and adnexa were then returned to the abdomen, and the hysterotomy and all operative sites were reinspected and excellent hemostasis was noted after irrigation and suction of the abdomen with warm saline.  The peritoneum was closed with a running stitch of 2-0 Vicryl. The fascia was reapproximated with 0 Monocryl in a simple running fashion. The subcutaneous layer was then reapproximated with interrupted sutures of 2-0 plain gut, and the skin was then closed with 4-0 monocryl, in a subcuticular fashion.  The patient  tolerated the procedure well. Sponge, lap, needle, and instrument counts were correct x 2. The patient was transferred  to the recovery room awake, alert and breathing independently in stable condition.  IT WAS DISCUSSED WITH THE PATIENT AND HUSBAND AFTER THE PROCEDURE,  WOULD NOT RECOMMEND A TOLAC IN THE FUTURE DUE TO THE NARROW PELVIS.    Cleda ClarksElizabeth W Mumaw, DO OB Fellow [facility](Faculty Practice)  Agree with above. I was present and scrubbed for the entire procedure. Patient to receive one dose of ampicillin, gentamycin and flagyl post operatively.  Cornelia Copaharlie Cabrina Shiroma, Jr MD Attending [facility](Faculty Practice)

## 2016-05-31 NOTE — Progress Notes (Signed)
OB Note  Category II tracing with q864m UCs and having accels. SVE unchanged since approx 7am today. Given arrest of dilation with IUPC and unable to titrate with pitocin due to fetal decelerations, recommend proceeding with urgent c-section which she is amenable to. Will do ancef and flagyl and do one dose of amp/gent/flagyl post op due to IAI.   Grace Joseph, Jr MD Attending [facility](Faculty Practice)

## 2016-05-31 NOTE — Anesthesia Postprocedure Evaluation (Signed)
Anesthesia Post Note  Patient: Grace Joseph  Procedure(s) Performed: Procedure(s) (LRB): CESAREAN SECTION (N/A)  Patient location during evaluation: PACU Anesthesia Type: Epidural Level of consciousness: awake and alert Pain management: pain level controlled Vital Signs Assessment: post-procedure vital signs reviewed and stable Respiratory status: spontaneous breathing, nonlabored ventilation, respiratory function stable and patient connected to nasal cannula oxygen Cardiovascular status: blood pressure returned to baseline and stable Postop Assessment: no signs of nausea or vomiting, epidural receding and patient able to bend at knees Anesthetic complications: no Comments: HR elevated but it has been since pitocin administration intra-operatively. Patient with no complaints. Pain 1/10. Afebrile. BP OK. HR has been down to 111 frequently. Plan to transfer to floor for further monitoring.     Last Vitals:  Vitals:   05/31/16 2020 05/31/16 2025  BP:    Pulse: (!) 114 (!) 136  Resp: 18 16  Temp:      Last Pain:  Vitals:   05/31/16 2015  TempSrc: Oral  PainSc: 1    Pain Goal: Patients Stated Pain Goal: 7 (05/30/16 1648)               Jerl Munyan J

## 2016-05-31 NOTE — Progress Notes (Signed)
LABOR PROGRESS NOTE  Grace Joseph is a 25 y.o. G1P0 at 6060w5d  admitted for SOL.  Subjective: Doing well. No concerns  Objective: BP 108/69   Pulse 92   Temp 98.5 F (36.9 C) (Axillary)   Resp 18   Ht 5\' 4"  (1.626 m)   Wt 182 lb (82.6 kg)   LMP 08/26/2015   SpO2 100%   BMI 31.24 kg/m  or  Vitals:   05/30/16 2332 05/31/16 0031 05/31/16 0101 05/31/16 0131  BP: 96/71 (!) 98/58 (!) 103/53 108/69  Pulse: 76 77 85 92  Resp: 18 18 18 18   Temp:      TempSrc:      SpO2:      Weight:      Height:        Dilation: 5 Effacement (%): 90 Cervical Position: Anterior Station: -2 Presentation: Vertex Exam by:: Dr. Nira Retortegele  FHT: baseline rate 140, moderate variability, +acel, no decel  Labs: Lab Results  Component Value Date   WBC 8.6 05/30/2016   HGB 13.8 05/30/2016   HCT 39.3 05/30/2016   MCV 91.2 05/30/2016   PLT 174 05/30/2016    Patient Active Problem List   Diagnosis Date Noted  . Active labor 05/30/2016  . Active labor at term 05/30/2016    Assessment / Plan: 25 y.o. G1P0 at 7860w5d here for SOL  Labor: AROM at 1:45, clear fluid. Continue expectant management Fetal Wellbeing:  Catergory I Pain Control:  Well-controlled with epidural Anticipated MOD:  SVD  Frederik PearJulie P Roscoe Witts, MD 05/31/2016, 1:46 AM\

## 2016-05-31 NOTE — Progress Notes (Signed)
Patient ID: Grace Joseph, female   DOB: 12/31/1990, 25 y.o.   MRN: [id number] LABOR PROGRESS NOTE  Grace Joseph is a 25 y.o. G1P0 at 6364w6d  admitted for SOL  Subjective: Doing well, feels comfortable  Objective: BP (!) 121/91   Pulse 92   Temp 97.7 F (36.5 C) (Oral)   Resp 16   Ht 5\' 4"  (1.626 m)   Wt 82.6 kg (182 lb)   LMP 08/26/2015   SpO2 100%   BMI 31.24 kg/m  or  Vitals:   05/31/16 0701 05/31/16 0731 05/31/16 0744 05/31/16 0800  BP: 110/78 (!) 121/91    Pulse: 92     Resp: 18 18 16 16   Temp:    97.7 F (36.5 C)  TempSrc:    Oral  SpO2:      Weight:      Height:         Dilation: 6 Effacement (%): 90 Cervical Position: Anterior Station: -1, 0 Presentation: Vertex Exam by:: S Nix RN FHT: 145 baseline, moderate variability, +accelerations, no decelerations Uterine activity: q2-443min  Labs: Lab Results  Component Value Date   WBC 8.6 05/30/2016   HGB 13.8 05/30/2016   HCT 39.3 05/30/2016   MCV 91.2 05/30/2016   PLT 174 05/30/2016    Patient Active Problem List   Diagnosis Date Noted  . Active labor 05/30/2016  . Active labor at term 05/30/2016    Assessment / Plan: 25 y.o. G1P0 at 7564w6d here for SOL  Labor: pitocin Fetal Wellbeing:  Category I Pain Control:  Comfortable with epidural Anticipated MOD:  SVD  Leland HerElsia J Yoo, DO PGY-1 9/19/20178:53 AM

## 2016-05-31 NOTE — Consult Note (Signed)
Neonatology Note:   Attendance at C-section:    I was asked by Dr. Pickens to attend this urgent C/S at term for FTP with decels. The mother is a G1, GBS + with aIAP and good prenatal care. Maternal temp peaked at 38.3 degrees celsius during labor course.  ROM 29 hours before delivery, fluid clear. Infant not vigorous nor with spontaneous cry or tone. Immediately brought to warmer and dried and stimulated. HR <100 but >60 thus CPAP started. Limited response with down trending HR. Repositioned and bulb suctioned large mucous plugs from nares and oropharynx.  CPAP resumed then a couple PPV breathes with good response. Sao2 initially in 50s at ~2 min with steady improvement to acceptable levels.  Fio2 weaned as well as CPAP. Good Air entry bilateral with sao2 in 90 and comfortable WOB at 5-6 in thus CPAP removed.  Infant remained stable on RA with appropriate saturations to 8min. Ap 2/9. Introduced to father and mother updated. To CN to care of Pediatrician.  Grace Joseph C. Grace Brick, MD 

## 2016-06-01 ENCOUNTER — Encounter (HOSPITAL_COMMUNITY): Payer: Self-pay | Admitting: Obstetrics and Gynecology

## 2016-06-01 LAB — CBC
HCT: 27.8 % — ABNORMAL LOW (ref 36.0–46.0)
HCT: 22.6 % — ABNORMAL LOW (ref 36.0–46.0)
HEMOGLOBIN: 8.1 g/dL — AB (ref 12.0–15.0)
HEMOGLOBIN: 9.8 g/dL — AB (ref 12.0–15.0)
MCH: 32 pg (ref 26.0–34.0)
MCH: 32.5 pg (ref 26.0–34.0)
MCHC: 35.3 g/dL (ref 30.0–36.0)
MCHC: 35.8 g/dL (ref 30.0–36.0)
MCV: 90.8 fL (ref 78.0–100.0)
MCV: 90.8 fL (ref 78.0–100.0)
Platelets: 140 10*3/uL — ABNORMAL LOW (ref 150–400)
Platelets: 123 10*3/uL — ABNORMAL LOW (ref 150–400)
RBC: 2.49 MIL/uL — AB (ref 3.87–5.11)
RBC: 3.06 MIL/uL — ABNORMAL LOW (ref 3.87–5.11)
RDW: 13.5 % (ref 11.5–15.5)
RDW: 13.7 % (ref 11.5–15.5)
WBC: 14.2 10*3/uL — ABNORMAL HIGH (ref 4.0–10.5)
WBC: 20.5 10*3/uL — AB (ref 4.0–10.5)

## 2016-06-01 LAB — CCBB MATERNAL DONOR DRAW

## 2016-06-01 MED ORDER — CEFAZOLIN SODIUM-DEXTROSE 2-4 GM/100ML-% IV SOLN
INTRAVENOUS | Status: AC
  Filled 2016-06-01: qty 100

## 2016-06-01 NOTE — Lactation Note (Signed)
This note was copied from a baby's chart. Lactation Consultation Note  Patient Name: Grace Joseph   Infant DAT+. Mom reports + breast changes w/pregnancy & reports some leakage near the end of pregnancy.  Mom assisted w/hand expression & infant was spoon-fed 2.425mL. Mom expressed w/ease. Mom was provided w/a colostrum container so she can hand express more in case infant is not interested in latching.   Mom also taught signs/sound of swallowing.   Grace Joseph, [facility], 8:21 PM

## 2016-06-01 NOTE — Progress Notes (Signed)
Subjective: Postoperative Day 1: Cesarean Delivery Patient reports tolerating PO.  Feels well this am, is eating well and pain is under good control. No BM or flatulence. Foley still in place this am  Objective: Vital signs in last 24 hours: Temp:  [97.6 F (36.4 C)-100.9 F (38.3 C)] 98 F (36.7 C) (09/20 0600) Pulse Rate:  [93-136] 98 (09/20 0600) Resp:  [14-25] 18 (09/20 0030) BP: (84-127)/(44-85) 90/44 (09/20 0600) SpO2:  [93 %-100 %] 98 % (09/20 0600)  Physical Exam:  General: alert, cooperative and no distress Lochia: appropriate Uterine Fundus: firm Incision: healing well, no significant drainage, no dehiscence, no significant erythema DVT Evaluation: No evidence of DVT seen on physical exam. Negative Homan's sign.   Recent Labs  05/30/16 1600 06/01/16 0047  HGB 13.8 9.8*  HCT 39.3 27.8*    Assessment/Plan: Status post Cesarean section. Doing well postoperatively.  Continue current care. Check CBC at 12PM  Leland Herlsia J Yoo 06/01/2016, 7:50 AM  OB FELLOW POSTPARTUM PROGRESS NOTE ATTESTATION  I have seen and examined this patient and agree with above documentation in the resident's note.   Ernestina PennaNicholas Kenidee Cregan, MD 12:07 PM

## 2016-06-01 NOTE — Lactation Note (Addendum)
This note was copied from a baby's chart. Lactation Consultation Note New mom c/s. Baby not interested in BF. Has been sleepy. Changed diaper to stimulate. Discussed feeding cues. Encouraged STS and spoon feeding to stimulate to BF. Educated newborn behavior and feeding habits, I&O, cluster feeding, supply and demand. Mom plans to breast/bottle and formula feed. Discussed LEAD.  Mom has tubular breast w/short shaft everted compressible nipple. 2 fingers space between breast. Hand expression w/easy flow of colostrum. Latched baby in football hold. Baby tongue thrust, frequently pushes nipple out of mouth. On breast 17 min. Suckles occasionally for about 5 minutes. Baby frequently sliding down to tip of nipple. Referred to Baby and Me Book in Breastfeeding section Pg. 22-23 for position options and Proper latch demonstration.Mom encouraged to feed baby 8-12 times/24 hours and with feeding cues. WH/LC brochure given w/resources, support groups and LC services.  Patient Name: Grace Joseph CullLeah Level ZOXWR'UToday's Date: 06/01/2016 Reason for consult: Initial assessment   Maternal Data Has patient been taught Hand Expression?: Yes Does the patient have breastfeeding experience prior to this delivery?: No  Feeding Feeding Type: Breast Fed Length of feed: 5 min  LATCH Score/Interventions Latch: Repeated attempts needed to sustain latch, nipple held in mouth throughout feeding, stimulation needed to elicit sucking reflex. Intervention(s): Adjust position;Assist with latch;Breast massage;Breast compression  Audible Swallowing: None Intervention(s): Skin to skin;Hand expression Intervention(s): Hand expression;Alternate breast massage  Type of Nipple: Everted at rest and after stimulation  Comfort (Breast/Nipple): Soft / non-tender     Hold (Positioning): Full assist, staff holds infant at breast Intervention(s): Breastfeeding basics reviewed;Support Pillows;Position options;Skin to skin  LATCH Score:  5  Lactation Tools Discussed/Used WIC Program: Yes   Consult Status Consult Status: Follow-up Date: 06/01/16 Follow-up type: In-patient    Marlyn Tondreau, Diamond NickelLAURA G 06/01/2016, 3:16 AM

## 2016-06-01 NOTE — Addendum Note (Signed)
Addendum  created 06/01/16 0944 by Algis GreenhouseLinda A Garvis Downum, CRNA   Sign clinical note

## 2016-06-01 NOTE — Anesthesia Postprocedure Evaluation (Signed)
Anesthesia Post Note  Patient: Grace Joseph  Procedure(s) Performed: Procedure(s) (LRB): CESAREAN SECTION (N/A)  Patient location during evaluation: Mother Baby Anesthesia Type: Epidural Level of consciousness: awake Pain management: satisfactory to patient Vital Signs Assessment: post-procedure vital signs reviewed and stable Respiratory status: spontaneous breathing Cardiovascular status: stable Anesthetic complications: no     Last Vitals:  Vitals:   06/01/16 0030 06/01/16 0600  BP: 127/70 (!) 90/44  Pulse: (!) 118 98  Resp: 18   Temp: 37.1 C 36.7 C    Last Pain:  Vitals:   06/01/16 0730  TempSrc:   PainSc: 0-No pain   Pain Goal: Patients Stated Pain Goal: 7 (05/30/16 1648)               Cephus ShellingBURGER,Kathlynn Swofford

## 2016-06-02 LAB — CBC
HEMATOCRIT: 20 % — AB (ref 36.0–46.0)
Hemoglobin: 7.2 g/dL — ABNORMAL LOW (ref 12.0–15.0)
MCH: 32.9 pg (ref 26.0–34.0)
MCHC: 36 g/dL (ref 30.0–36.0)
MCV: 91.3 fL (ref 78.0–100.0)
Platelets: 117 10*3/uL — ABNORMAL LOW (ref 150–400)
RBC: 2.19 MIL/uL — ABNORMAL LOW (ref 3.87–5.11)
RDW: 13.8 % (ref 11.5–15.5)
WBC: 12.4 10*3/uL — ABNORMAL HIGH (ref 4.0–10.5)

## 2016-06-02 MED ORDER — SODIUM CHLORIDE 0.9 % IV SOLN
510.0000 mg | Freq: Once | INTRAVENOUS | Status: AC
  Administered 2016-06-02: 510 mg via INTRAVENOUS
  Filled 2016-06-02: qty 17

## 2016-06-02 MED ORDER — FLEET ENEMA 7-19 GM/118ML RE ENEM: 1.0000 | ENEMA | Freq: Once | RECTAL | Status: DC

## 2016-06-02 MED ORDER — FERROUS SULFATE 325 (65 FE) MG PO TABS
325.0000 mg | ORAL_TABLET | Freq: Two times a day (BID) | ORAL | Status: DC
  Administered 2016-06-02 – 2016-06-04 (×5): 325 mg via ORAL
  Filled 2016-06-02 (×5): qty 1

## 2016-06-02 NOTE — Lactation Note (Addendum)
This note was copied from a baby's chart. Lactation Consultation Note Baby hasn't had minimal output. Oral mucosa dry. Had 4% weight loss. Mom has been doing some hand expression and spoon feeding. Has been fussy. Hand expressed mom has colostrum, mom feels it isn't enough. Educated baby's stomach size. Mom encouraged to feed baby 8-12 times/24 hours and with feeding cues. Mom shown how to use DEBP & how to disassemble, clean, & reassemble parts. Mom knows to pump q3h for 15-20 min. Mom is breast/formula. Gave Similac and feeding guideline. Fed baby and discussed supplementing. BF first, then supplement if needed. Discussed supply and demand.  Patient Name: Grace Joseph CullLeah Roper FAOZH'YToday's Date: 06/02/2016 Reason for consult: Follow-up assessment;Infant weight loss   Maternal Data    Feeding Feeding Type: Bottle Fed - Formula Nipple Type: Slow - flow  LATCH Score/Interventions       Type of Nipple: Everted at rest and after stimulation  Comfort (Breast/Nipple): Soft / non-tender     Intervention(s): Breastfeeding basics reviewed;Support Pillows;Position options;Skin to skin     Lactation Tools Discussed/Used Tools: Pump Breast pump type: Double-Electric Breast Pump Pump Review: Setup, frequency, and cleaning;Milk Storage Initiated by:: Elbert EwingsL. Maris Bena RN IBCLC Date initiated:: 06/02/16   Consult Status Consult Status: Follow-up Date: 06/03/16 Follow-up type: In-patient    Charyl DancerCARVER, Daveigh Batty G 06/02/2016, 5:31 AM

## 2016-06-02 NOTE — Progress Notes (Signed)
Subjective: Postoperative Day 2: Cesarean Delivery Patient reports tolerating PO, + flatus and no problems voiding. Feeling stiff from staying in bed, will try ambulating today.  Objective: Vital signs in last 24 hours: Temp:  [97.6 F (36.4 C)-98.1 F (36.7 C)] 97.8 F (36.6 C) (09/21 0619) Pulse Rate:  [82-95] 86 (09/21 0619) Resp:  [18-20] 20 (09/21 0619) BP: (90-110)/(44-80) 93/45 (09/21 0619) SpO2:  [98 %] 98 % (09/21 19140619)  Physical Exam:  General: alert, cooperative and no distress Lochia: appropriate Uterine Fundus: firm Incision: healing well, no significant drainage, no dehiscence, no significant erythema DVT Evaluation: No evidence of DVT seen on physical exam. Negative Homan's sign.   Recent Labs  06/01/16 1149 06/02/16 0510  HGB 8.1* 7.2*  HCT 22.6* 20.0*    Assessment/Plan: Status post Cesarean section. Doing well postoperatively.  Start iron supplementation. Continue current care.  Leland HerElsia J Yoo PGY-1 06/02/2016, 7:55 AM   I have seen and examined this patient and I agree with the above. Feraheme infusion planned for today. Cam HaiSHAW, Erdine Hulen CNM 9:49 AM 06/02/2016

## 2016-06-02 NOTE — Lactation Note (Signed)
This note was copied from a baby's chart. Lactation Consultation Note  Mother states she is breastfeeding and supplementing after with pumped colostrum and alimentum. Denies problems or concerns. Suggest she call if she needs assistance. Last time she pumped she received 5 ml of colostrum. Encouraged mother to keep going plan.  Discussed supply and demand.  Patient Name: Grace Joseph VOZDG'UToday's Date: 06/02/2016 Reason for consult: Follow-up assessment   Maternal Data    Feeding Feeding Type: Bottle Fed - Breast Milk Length of feed: 10 min  LATCH Score/Interventions                      Lactation Tools Discussed/Used     Consult Status Consult Status: Follow-up Date: 06/03/16 Follow-up type: In-patient    Dahlia ByesBerkelhammer, Ruth Jackson [facility] 06/02/2016, 2:08 PM

## 2016-06-03 LAB — CBC
HEMATOCRIT: 18.2 % — AB (ref 36.0–46.0)
HEMOGLOBIN: 6.6 g/dL — AB (ref 12.0–15.0)
MCH: 33 pg (ref 26.0–34.0)
MCHC: 36.3 g/dL — ABNORMAL HIGH (ref 30.0–36.0)
MCV: 91 fL (ref 78.0–100.0)
Platelets: 151 10*3/uL (ref 150–400)
RBC: 2 MIL/uL — AB (ref 3.87–5.11)
RDW: 14.1 % (ref 11.5–15.5)
WBC: 7.9 10*3/uL (ref 4.0–10.5)

## 2016-06-03 LAB — HEMOGLOBIN AND HEMATOCRIT, BLOOD
HEMATOCRIT: 28.4 % — AB (ref 36.0–46.0)
Hemoglobin: 10 g/dL — ABNORMAL LOW (ref 12.0–15.0)

## 2016-06-03 LAB — PREPARE RBC (CROSSMATCH)

## 2016-06-03 MED ORDER — SENNOSIDES-DOCUSATE SODIUM 8.6-50 MG PO TABS: 1.0000 | ORAL_TABLET | Freq: Every evening | ORAL | Status: DC | PRN

## 2016-06-03 MED ORDER — IBUPROFEN 600 MG PO TABS
600.0000 mg | ORAL_TABLET | Freq: Four times a day (QID) | ORAL | Status: DC | PRN
  Administered 2016-06-03 (×2): 600 mg via ORAL
  Filled 2016-06-03 (×3): qty 1

## 2016-06-03 MED ORDER — SODIUM CHLORIDE 0.9 % IV SOLN
Freq: Once | INTRAVENOUS | Status: AC
  Administered 2016-06-03: 08:00:00 via INTRAVENOUS

## 2016-06-03 NOTE — Lactation Note (Signed)
This note was copied from a baby's chart. Lactation Consultation Note  Mother recently pumped 10 ml with DEBP. Attempted latching in football hold.  Baby has narrow gape and had difficulty sustaining latch. Performed chin tug to help widen latch baby became sleepy during consult. Suggest mother call for assistance w/ next feeding to help deepen latch. Mother anemic, sleepy and receiving blood transfusion during consult.  Patient Name: Boy Gershon CullLeah Litz ZOXWR'UToday's Date: 06/03/2016 Reason for consult: Follow-up assessment   Maternal Data    Feeding Feeding Type: Breast Fed Nipple Type: Slow - flow Length of feed: 0 min  LATCH Score/Interventions Latch: Too sleepy or reluctant, no latch achieved, no sucking elicited. Intervention(s): Waking techniques;Skin to skin Intervention(s): Adjust position;Assist with latch;Breast massage  Audible Swallowing: None Intervention(s): Skin to skin Intervention(s): Skin to skin;Hand expression  Type of Nipple: Everted at rest and after stimulation  Comfort (Breast/Nipple): Soft / non-tender     Hold (Positioning): Assistance needed to correctly position infant at breast and maintain latch. Intervention(s): Support Pillows;Skin to skin;Position options  LATCH Score: 5  Lactation Tools Discussed/Used     Consult Status Consult Status: Follow-up Date: 06/04/16 Follow-up type: In-patient    Dahlia ByesBerkelhammer, Theresea Trautmann [facility] 06/03/2016, 11:15 AM

## 2016-06-03 NOTE — Progress Notes (Signed)
Pt vital signs obtained after 15 minutes of starting blood transfusion.  Pt denied any feeling of a reactions, HR increased from 89 to 100.  Pt chart indicated pt had prior increased HR.  MD on call, Dr. Artist PaisYoo notified and RN given OK to continue transfusion.  Will continue to monitor pt for s/s of blood transfusion.

## 2016-06-03 NOTE — Progress Notes (Signed)
Patient having difficulty latching baby due to edema, breast full and tender and firm to touch. Educated patient again on how to hand express and to pump every 3 hours and to prepump before latching the baby.Encouraged using the ice packs also. Alimentum given at mothers request to supplement until she pumps again. Patient up in halls ambulating, bilateral lower leg edema +1, patient stated they have been swollen. Oxycodone 10mg  and tyelnol given for pain. No s/s of acute distress. Will continue to monitor.

## 2016-06-03 NOTE — Progress Notes (Signed)
POSTPARTUM PROGRESS NOTE  Post Partum/POD #3  Subjective:  Grace Joseph is a 25 y.o. G1P1001 5573w6d s/p PLTCS.  No acute events overnight.  Pt denies problems with ambulating, voiding or po intake.  She denies nausea or vomiting.  Pain is well controlled.  She has had flatus.   Lochia Small. Does endorse some lightheadedness with ambulation.  Objective: Blood pressure 116/73, pulse 98, temperature 98 F (36.7 C), temperature source Oral, resp. rate 18, height 5\' 4"  (1.626 m), weight 182 lb (82.6 kg), last menstrual period 08/26/2015, SpO2 100 %, unknown if currently breastfeeding.  Physical Exam:  General: alert, cooperative and no distress Lochia:normal flow Chest: no respiratory distress Heart:regular rate, distal pulses intact Abdomen: soft, nontender,  Uterine Fundus: firm, appropriately tender Skin: incision with dressing in place, clean and dry DVT Evaluation: No calf swelling or tenderness Extremities: trace edema   Recent Labs  06/02/16 0510 06/03/16 0551  HGB 7.2* 6.6*  HCT 20.0* 18.2*    Assessment/Plan:  ASSESSMENT: Grace CullLeah Celaya is a 25 y.o. G1P1001 1973w6d s/p PLTCS. PPH with EBL 1300. Hgb down to 6.6.   Transfuse 2u PRBCs Check posttransfusion H&H AM CBC  Continue po iron    LOS: 4 days   Frederik PearJulie P Darrian Grzelak, MD 06/03/2016, 7:16 AM

## 2016-06-03 NOTE — Plan of Care (Signed)
Problem: Education: Goal: Knowledge of condition will improve Outcome: Progressing Pt educated on blood product administration and s/s of a reaction.  Pt verbalizes understanding.

## 2016-06-03 NOTE — Progress Notes (Signed)
Critical lab value of 6.6 hgb called to resident. Pt asymptomatic.

## 2016-06-04 LAB — TYPE AND SCREEN
ABO/RH(D): O POS
Antibody Screen: NEGATIVE
UNIT DIVISION: 0
Unit division: 0

## 2016-06-04 LAB — HEMOGLOBIN AND HEMATOCRIT, BLOOD
HCT: 28.1 % — ABNORMAL LOW (ref 36.0–46.0)
Hemoglobin: 10 g/dL — ABNORMAL LOW (ref 12.0–15.0)

## 2016-06-04 MED ORDER — FERROUS SULFATE 325 (65 FE) MG PO TABS: 325.0000 mg | ORAL_TABLET | Freq: Two times a day (BID) | ORAL | 3 refills | Status: DC

## 2016-06-04 MED ORDER — IBUPROFEN 600 MG PO TABS: 600.0000 mg | ORAL_TABLET | Freq: Four times a day (QID) | ORAL | 0 refills | Status: DC | PRN

## 2016-06-04 MED ORDER — OXYCODONE HCL 5 MG PO TABS: 5.0000 mg | ORAL_TABLET | ORAL | 0 refills | Status: DC | PRN

## 2016-06-04 NOTE — Lactation Note (Signed)
This note was copied from a baby's chart. Lactation Consultation Note  Patient Name: Grace Gershon CullLeah Kishimoto UJWJX'BToday's Date: 06/04/2016 Reason for consult: Follow-up assessment;Breast/nipple pain;Difficult latch Mom called for assist with latch. She has been giving lots of bottles due to difficult latch and pain with latch. Breasts are now engorged. Mom has positional stripe on right nipple. The right nipple is erect, the left nipple has short shaft with deep dimpling center of nipple, this nipple is sore as well.  Had Mom pre-pump to soften nipple/aerola prior to latch. Attempted to latch baby to right breast but baby was unable to obtain good depth. After several attempts, initiated #24 nipple shield to help with pain with latch and for baby to obtain depth. Baby demonstrated some good suckling bursts, some breast milk in nipple shield, breast softening with baby nursing. Baby fell asleep after 10 minutes at breast, breast softened with nursing. Mom post pumping both breasts to empty well, using 24 flange. Care for sore nipples reviewed, comfort gels given with instructions. Plan for d/c: Advised Mom to BF frequently, do not let breasts become firm between feedings. Pre-pump to soften nipple aerola, use #24 nipple shield to latch. Keep baby nursing for 15-30 minutes both breasts each feeding. Breast should be softening, post pump as needed to soften breasts, apply ice packs for the next 24-48 hours to treat engorgement. If baby nursing frequently, breast softening, baby having enough voids/stools, Mom should not need to supplement but if she does supplement advised to use EBM not formula. Encouraged Mom to schedule OP f/u, she will call. Advised of support group and encouraged to call for questions/concerns.   Maternal Data    Feeding Feeding Type: Breast Fed Length of feed: 10 min  LATCH Score/Interventions Latch: Repeated attempts needed to sustain latch, nipple held in mouth throughout feeding, stimulation  needed to elicit sucking reflex. (initiated #24 nipple shield) Intervention(s): Adjust position;Assist with latch;Breast massage;Breast compression  Audible Swallowing: A few with stimulation  Type of Nipple: Everted at rest and after stimulation (left nipple deep dimple, short shaft)  Comfort (Breast/Nipple): Engorged, cracked, bleeding, large blisters, severe discomfort Problem noted: Engorgment Intervention(s): Ice;Hand expression  Problem noted: Mild/Moderate discomfort;Cracked, bleeding, blisters, bruises Interventions (Filling): Frequent nursing;Massage;Hand pump;Double electric pump Interventions (Mild/moderate discomfort): Comfort gels  Hold (Positioning): Assistance needed to correctly position infant at breast and maintain latch. Intervention(s): Breastfeeding basics reviewed;Support Pillows;Skin to skin;Position options  LATCH Score: 5  Lactation Tools Discussed/Used Tools: Nipple Shields;Pump;Comfort gels Nipple shield size: 24 Breast pump type: Double-Electric Breast Pump   Consult Status Consult Status: Complete Date: 06/04/16 Follow-up type: In-patient    Grace Joseph, Grace Joseph 06/04/2016, 2:41 PM

## 2016-06-04 NOTE — Discharge Instructions (Signed)

## 2016-06-04 NOTE — Discharge Summary (Signed)
OB Discharge Summary     Patient Name: Grace Joseph DOB: 09/30/1990 MRN: [id number]  Date of admission: 05/30/2016 Delivering MD: [place] BingPICKENS, CHARLIE   Date of discharge: 06/04/2016  Admitting diagnosis: 39WKS,LABOR Intrauterine pregnancy: 9214w6d     Secondary diagnosis:  Active Problems:   Active labor   Active labor at term  Additional problems: none     Discharge diagnosis: Term Pregnancy Delivered and Anemia                                                                                                Post partum procedures:blood transfusion x 2u PRBCs  Augmentation: AROM and Pitocin  Complications: Intrauterine Inflammation or infection (Chorioamniotis)- received Ancef and Flagyl, then one dose Amp/Gen/Flagyl post op  Hospital course:  Onset of Labor With Unplanned C/S  25 y.o. yo G1P1001 at 6014w6d was admitted in Latent Labor on 05/30/2016. Patient had a labor course significant for progressing to 7cm and then having FHR decels requiring C/S. Membrane Rupture Time/Date: 1:42 AM ,05/31/2016   The patient went for cesarean section due to Non-Reassuring FHR, and delivered a Viable infant,05/31/2016  Details of operation can be found in separate operative note. Patient had a postpartum course involving decreasing Hgb as it was monitored daily prior to receiving a blood tx on 9/22.  She is ambulating,tolerating a regular diet, passing flatus, and urinating well; feels much better post transfusion.  Patient is discharged home in stable condition 06/04/16.   Physical exam Vitals:   06/03/16 1431 06/03/16 1528 06/03/16 1751 06/04/16 0551  BP: 114/64 129/85 124/78 139/83  Pulse: 95 91 85 76  Resp:  17 19 18   Temp: 98.3 F (36.8 C) 98.1 F (36.7 C) 97.5 F (36.4 C) 97.5 F (36.4 C)  TempSrc: Oral Oral Oral Oral  SpO2: 100% 100%    Weight:      Height:       General: alert and cooperative Lochia: appropriate Uterine Fundus: firm Incision: Dressing is clean, dry, and intact DVT  Evaluation: No evidence of DVT seen on physical exam. Labs: Lab Results  Component Value Date   WBC 7.9 06/03/2016   HGB 10.0 (L) 06/04/2016   HCT 28.1 (L) 06/04/2016   MCV 91.0 06/03/2016   PLT 151 06/03/2016   No flowsheet data found.  Discharge instruction: per After Visit Summary and "Baby and Me Booklet".  After visit meds:    Medication List    STOP taking these medications   acetaminophen 500 MG tablet Commonly known as:  TYLENOL   BENADRYL ALLERGY 25 mg capsule Generic drug:  diphenhydrAMINE     TAKE these medications   ferrous sulfate 325 (65 FE) MG tablet Take 1 tablet (325 mg total) by mouth 2 (two) times daily with a meal.   ibuprofen 600 MG tablet Commonly known as:  ADVIL,MOTRIN Take 1 tablet (600 mg total) by mouth every 6 (six) hours as needed for headache or cramping.   oxyCODONE 5 MG immediate release tablet Commonly known as:  Oxy IR/ROXICODONE Take 1 tablet (5 mg total) by mouth every 4 (four) hours as  needed (pain scale 4-7).   prenatal multivitamin Tabs tablet Take 1 tablet by mouth daily at 12 noon.       Diet: routine diet  Activity: Advance as tolerated. Pelvic rest for 6 weeks.   Outpatient follow up:6 weeks Follow up Appt:No future appointments. Follow up Visit:No Follow-up on file.  Postpartum contraception: Progesterone only pills  Newborn Data: Live born female  Birth Weight: 6 lb 13.9 oz (3115 g) APGAR: 2, 9  Baby Feeding: Bottle and Breast Disposition:home with mother   06/04/2016 Cam Hai, CNM  9:09 AM

## 2016-06-04 NOTE — Lactation Note (Signed)
This note was copied from a baby's chart. Lactation Consultation Note  Patient Name: Boy Gershon CullLeah Durkee ZOXWR'UToday's Date: 06/04/2016 Reason for consult: Follow-up assessment;Difficult latch Mom reports breast are engorged, baby is having difficulty with latch. She has been pump/bottle feeding EBM and formula. Mom reports she wants baby to be at breast with feedings. Baby recently had bottle and asleep, Mom having lunch. LC advised Mom to call with next feeding for LC assist. Advised to pre-pump to help with latch.   Maternal Data    Feeding Feeding Type: Breast Milk Nipple Type: Slow - flow  LATCH Score/Interventions                      Lactation Tools Discussed/Used Tools: Pump Breast pump type: Double-Electric Breast Pump   Consult Status Consult Status: Follow-up Date: 06/04/16 Follow-up type: In-patient    [name], Ihsan Nomura Ann 06/04/2016, 11:10 AM

## 2016-08-18 ENCOUNTER — Encounter (HOSPITAL_COMMUNITY): Payer: Self-pay | Admitting: Family Medicine

## 2016-08-18 ENCOUNTER — Ambulatory Visit (HOSPITAL_COMMUNITY)
Admission: EM | Admit: 2016-08-18 | Discharge: 2016-08-18 | Disposition: A | Discharge status: Home / Self Care | Payer: Medicaid Other | Attending: Emergency Medicine | Admitting: Emergency Medicine

## 2016-08-18 DIAGNOSIS — J029 Acute pharyngitis, unspecified: Secondary | ICD-10-CM | POA: Insufficient documentation

## 2016-08-18 DIAGNOSIS — Z79899 Other long term (current) drug therapy: Secondary | ICD-10-CM | POA: Diagnosis not present

## 2016-08-18 LAB — TSH: TSH: 0.864 u[IU]/mL (ref 0.350–4.500)

## 2016-08-18 LAB — POCT RAPID STREP A: Streptococcus, Group A Screen (Direct): NEGATIVE

## 2016-08-18 MED ORDER — AMOXICILLIN 500 MG PO CAPS: 1000.0000 mg | ORAL_CAPSULE | Freq: Two times a day (BID) | ORAL | 0 refills | Status: DC

## 2016-08-18 NOTE — ED Triage Notes (Signed)
Pt here for sore throat and swollen tonsils. White patches on throat.

## 2016-08-18 NOTE — ED Provider Notes (Signed)
[facility]    CSN: [id number] Arrival date & time: 08/18/16  1327     History   Chief Complaint Chief Complaint  Patient presents with  . Sore Throat    HPI Grace Joseph is a 25 y.o. female.   HPI She is a 25 year old woman here for evaluation of sore throat. Symptoms started 2 days ago with sore throat, worse with swallowing. She has seen white patches on her tonsils. No fevers. Denies any significant runny nose, nasal congestion, or cough. No nausea or vomiting.  Past Medical History:  Diagnosis Date  . Medical history non-contributory     Patient Active Problem List   Diagnosis Date Noted  . Active labor 05/30/2016  . Active labor at term 05/30/2016    Past Surgical History:  Procedure Laterality Date  . CESAREAN SECTION N/A 05/31/2016   Procedure: CESAREAN SECTION;  Surgeon: [place] Bingharlie Pickens, MD;  [facility];  Service: Obstetrics;  Laterality: N/A;  . NO PAST SURGERIES      OB History    Gravida Para Term Preterm AB Living   1 1 1     1    SAB TAB Ectopic Multiple Live Births         0 1       Home Medications    Prior to Admission medications   Medication Sig Start Date End Date Taking? Authorizing Provider  amoxicillin (AMOXIL) 500 MG capsule Take 2 capsules (1,000 mg total) by mouth 2 (two) times daily. 08/18/16   Charm RingsErin J Chance Munter, MD  ferrous sulfate 325 (65 FE) MG tablet Take 1 tablet (325 mg total) by mouth 2 (two) times daily with a meal. 06/04/16   Arabella MerlesKimberly D Shaw, CNM  ibuprofen (ADVIL,MOTRIN) 600 MG tablet Take 1 tablet (600 mg total) by mouth every 6 (six) hours as needed for headache or cramping. 06/04/16   Arabella MerlesKimberly D Shaw, CNM  Prenatal Vit-Fe Fumarate-FA (PRENATAL MULTIVITAMIN) TABS tablet Take 1 tablet by mouth daily at 12 noon.    Historical Provider, MD    Family History Family History  Problem Relation Age of Onset  . Hypertension Mother     Social History Social History  Substance Use Topics  . Smoking status:  Never Smoker  . Smokeless tobacco: Never Used  . Alcohol use Yes     Allergies   Patient has no known allergies.   Review of Systems Review of Systems As in history of present illness  Physical Exam Triage Vital Signs ED Triage Vitals  Enc Vitals Group     BP 08/18/16 1352 111/82     Pulse Rate 08/18/16 1352 100     Resp 08/18/16 1352 18     Temp 08/18/16 1352 98.4 F (36.9 C)     Temp src --      SpO2 08/18/16 1352 100 %     Weight --      Height --      Head Circumference --      Peak Flow --      Pain Score 08/18/16 1353 5     Pain Loc --      Pain Edu? --      Excl. in GC? --    No data found.   Updated Vital Signs BP 111/82   Pulse 100   Temp 98.4 F (36.9 C)   Resp 18   SpO2 100%   Visual Acuity Right Eye Distance:   Left Eye Distance:   Bilateral  Distance:    Right Eye Near:   Left Eye Near:    Bilateral Near:     Physical Exam  Constitutional: She is oriented to person, place, and time. She appears well-developed and well-nourished. No distress.  HENT:  Nose: Nose normal.  Mouth/Throat: Oropharyngeal exudate present.  Neck: Neck supple. Thyromegaly (diffuse) present.  Cardiovascular: Normal rate, regular rhythm and normal heart sounds.   No murmur heard. Pulmonary/Chest: Effort normal and breath sounds normal. No respiratory distress. She has no wheezes. She has no rales.  Lymphadenopathy:    She has no cervical adenopathy.  Neurological: She is alert and oriented to person, place, and time.     UC Treatments / Results  Labs (all labs ordered are listed, but only abnormal results are displayed) Labs Reviewed  TSH  POCT RAPID STREP A    EKG  EKG Interpretation None       Radiology No results found.  Procedures Procedures (including critical care time)  Medications Ordered in UC Medications - No data to display   Initial Impression / Assessment and Plan / UC Course  I have reviewed the triage vital signs and the  nursing notes.  Pertinent labs & imaging results that were available during my care of the patient were reviewed by me and considered in my medical decision making (see chart for details).  Clinical Course     We'll treat tonsillitis with amoxicillin. Rapid strep is negative, throat culture pending. We'll also check a TSH given her enlarged thyroid. Follow-up as needed.  Final Clinical Impressions(s) / UC Diagnoses   Final diagnoses:  Pharyngitis, unspecified etiology    New Prescriptions New Prescriptions   AMOXICILLIN (AMOXIL) 500 MG CAPSULE    Take 2 capsules (1,000 mg total) by mouth 2 (two) times daily.     Charm RingsErin J Orlanda Lemmerman, MD 08/18/16 [phone]

## 2016-08-18 NOTE — Discharge Instructions (Signed)
Your strep test is negative. With how your tonsils look, I'm going to put you on amoxicillin just to be safe. Take it twice a day for 10 days. Saltwater gargles, tea with honey, Chloraseptic spray can help with the sore throat. Your thyroid is a little enlarged. We did a blood test to make sure it's working right. We will call you with the results. Follow-up as needed.

## 2016-08-20 LAB — CULTURE, GROUP A STREP (THRC)

## 2016-08-22 ENCOUNTER — Telehealth (HOSPITAL_COMMUNITY): Payer: Self-pay | Admitting: Emergency Medicine

## 2016-08-22 NOTE — Telephone Encounter (Signed)
-----   Message from Charm RingsErin J Honig, MD sent at 08/21/2016  1:02 PM EST ----- Throat culture with non-group A strep.  [facility] Follow up as needed. EH

## 2016-08-22 NOTE — Telephone Encounter (Signed)
Called pt and notified of recent lab results  Pt ID'd properly... Reports feeling better and sx have subsided Pt is taking/tolarating well meds given at visit.  Adv pt if sx are not getting better to return or to f/u w/PCP Pt verb understanding.      

## 2020-07-13 DIAGNOSIS — O411230 Chorioamnionitis, third trimester, not applicable or unspecified: Secondary | ICD-10-CM | POA: Diagnosis not present

## 2020-07-14 ENCOUNTER — Encounter (HOSPITAL_COMMUNITY): Payer: Self-pay | Admitting: Obstetrics and Gynecology

## 2020-07-14 ENCOUNTER — Inpatient Hospital Stay (HOSPITAL_BASED_OUTPATIENT_CLINIC_OR_DEPARTMENT_OTHER): Payer: Medicaid Other

## 2020-07-14 ENCOUNTER — Inpatient Hospital Stay (HOSPITAL_COMMUNITY): Payer: Medicaid Other | Admitting: Anesthesiology

## 2020-07-14 ENCOUNTER — Inpatient Hospital Stay (EMERGENCY_DEPARTMENT_HOSPITAL)
Admission: AD | Admit: 2020-07-14 | Discharge: 2020-07-14 | Disposition: A | Discharge status: Home / Self Care | Payer: Medicaid Other | Source: Home / Self Care | Attending: Obstetrics & Gynecology | Admitting: Obstetrics & Gynecology

## 2020-07-14 ENCOUNTER — Other Ambulatory Visit: Payer: Self-pay

## 2020-07-14 ENCOUNTER — Encounter (HOSPITAL_COMMUNITY): Payer: Self-pay | Admitting: Obstetrics & Gynecology

## 2020-07-14 ENCOUNTER — Inpatient Hospital Stay (HOSPITAL_COMMUNITY)
Admission: AD | Admit: 2020-07-14 | Discharge: 2020-07-19 | DRG: 786 | Disposition: A | Discharge status: Home / Self Care | Payer: Medicaid Other | Attending: Obstetrics & Gynecology | Admitting: Obstetrics & Gynecology

## 2020-07-14 DIAGNOSIS — O411230 Chorioamnionitis, third trimester, not applicable or unspecified: Secondary | ICD-10-CM | POA: Diagnosis present

## 2020-07-14 DIAGNOSIS — R140 Abdominal distension (gaseous): Secondary | ICD-10-CM

## 2020-07-14 DIAGNOSIS — Z20822 Contact with and (suspected) exposure to covid-19: Secondary | ICD-10-CM | POA: Diagnosis present

## 2020-07-14 DIAGNOSIS — E669 Obesity, unspecified: Secondary | ICD-10-CM | POA: Diagnosis present

## 2020-07-14 DIAGNOSIS — O34211 Maternal care for low transverse scar from previous cesarean delivery: Secondary | ICD-10-CM | POA: Diagnosis present

## 2020-07-14 DIAGNOSIS — O99824 Streptococcus B carrier state complicating childbirth: Secondary | ICD-10-CM | POA: Diagnosis present

## 2020-07-14 DIAGNOSIS — O99214 Obesity complicating childbirth: Secondary | ICD-10-CM | POA: Diagnosis present

## 2020-07-14 DIAGNOSIS — K567 Ileus, unspecified: Secondary | ICD-10-CM | POA: Diagnosis present

## 2020-07-14 DIAGNOSIS — O9963 Diseases of the digestive system complicating the puerperium: Secondary | ICD-10-CM | POA: Diagnosis present

## 2020-07-14 DIAGNOSIS — O471 False labor at or after 37 completed weeks of gestation: Secondary | ICD-10-CM

## 2020-07-14 DIAGNOSIS — O9081 Anemia of the puerperium: Secondary | ICD-10-CM | POA: Diagnosis not present

## 2020-07-14 DIAGNOSIS — D62 Acute posthemorrhagic anemia: Secondary | ICD-10-CM | POA: Diagnosis not present

## 2020-07-14 DIAGNOSIS — M955 Acquired deformity of pelvis: Secondary | ICD-10-CM | POA: Diagnosis present

## 2020-07-14 DIAGNOSIS — O403XX0 Polyhydramnios, third trimester, not applicable or unspecified: Principal | ICD-10-CM | POA: Diagnosis present

## 2020-07-14 DIAGNOSIS — O99893 Other specified diseases and conditions complicating puerperium: Secondary | ICD-10-CM | POA: Diagnosis not present

## 2020-07-14 DIAGNOSIS — Z363 Encounter for antenatal screening for malformations: Secondary | ICD-10-CM | POA: Diagnosis not present

## 2020-07-14 DIAGNOSIS — O289 Unspecified abnormal findings on antenatal screening of mother: Secondary | ICD-10-CM

## 2020-07-14 DIAGNOSIS — Z3A38 38 weeks gestation of pregnancy: Secondary | ICD-10-CM | POA: Insufficient documentation

## 2020-07-14 DIAGNOSIS — O904 Postpartum acute kidney failure: Secondary | ICD-10-CM | POA: Diagnosis not present

## 2020-07-14 DIAGNOSIS — O288 Other abnormal findings on antenatal screening of mother: Secondary | ICD-10-CM

## 2020-07-14 DIAGNOSIS — N179 Acute kidney failure, unspecified: Secondary | ICD-10-CM | POA: Diagnosis not present

## 2020-07-14 DIAGNOSIS — O26893 Other specified pregnancy related conditions, third trimester: Secondary | ICD-10-CM | POA: Diagnosis present

## 2020-07-14 DIAGNOSIS — R000 Tachycardia, unspecified: Secondary | ICD-10-CM | POA: Diagnosis not present

## 2020-07-14 DIAGNOSIS — Z3A37 37 weeks gestation of pregnancy: Secondary | ICD-10-CM

## 2020-07-14 LAB — CBC
HCT: 39.5 % (ref 36.0–46.0)
Hemoglobin: 13 g/dL (ref 12.0–15.0)
MCH: 30.7 pg (ref 26.0–34.0)
MCHC: 32.9 g/dL (ref 30.0–36.0)
MCV: 93.2 fL (ref 80.0–100.0)
Platelets: 189 10*3/uL (ref 150–400)
RBC: 4.24 MIL/uL (ref 3.87–5.11)
RDW: 13.2 % (ref 11.5–15.5)
WBC: 7.7 10*3/uL (ref 4.0–10.5)
nRBC: 0 % (ref 0.0–0.2)

## 2020-07-14 LAB — URINALYSIS, ROUTINE W REFLEX MICROSCOPIC
Bilirubin Urine: NEGATIVE
Glucose, UA: NEGATIVE mg/dL
Hgb urine dipstick: NEGATIVE
Ketones, ur: NEGATIVE mg/dL
Leukocytes,Ua: NEGATIVE
Nitrite: NEGATIVE
Protein, ur: NEGATIVE mg/dL
Specific Gravity, Urine: 1.018 (ref 1.005–1.030)
pH: 6 (ref 5.0–8.0)

## 2020-07-14 MED ORDER — FENTANYL-BUPIVACAINE-NACL 0.5-0.125-0.9 MG/250ML-% EP SOLN
12.0000 mL/h | EPIDURAL | Status: DC | PRN
  Administered 2020-07-15: 12 mL/h via EPIDURAL
  Filled 2020-07-14 (×2): qty 250

## 2020-07-14 MED ORDER — LACTATED RINGERS IV SOLN: 500.0000 mL | INTRAVENOUS | Status: DC | PRN

## 2020-07-14 MED ORDER — PHENYLEPHRINE 40 MCG/ML (10ML) SYRINGE FOR IV PUSH (FOR BLOOD PRESSURE SUPPORT)
80.0000 ug | PREFILLED_SYRINGE | INTRAVENOUS | Status: DC | PRN
  Filled 2020-07-14 (×2): qty 10

## 2020-07-14 MED ORDER — LIDOCAINE HCL (PF) 1 % IJ SOLN: 30.0000 mL | INTRAMUSCULAR | Status: DC | PRN

## 2020-07-14 MED ORDER — ACETAMINOPHEN 325 MG PO TABS: 650.0000 mg | ORAL_TABLET | ORAL | Status: DC | PRN

## 2020-07-14 MED ORDER — LACTATED RINGERS IV SOLN
500.0000 mL | Freq: Once | INTRAVENOUS | Status: AC
  Administered 2020-07-14: 500 mL via INTRAVENOUS

## 2020-07-14 MED ORDER — PHENYLEPHRINE 40 MCG/ML (10ML) SYRINGE FOR IV PUSH (FOR BLOOD PRESSURE SUPPORT): 80.0000 ug | PREFILLED_SYRINGE | INTRAVENOUS | Status: DC | PRN

## 2020-07-14 MED ORDER — OXYCODONE-ACETAMINOPHEN 5-325 MG PO TABS: 1.0000 | ORAL_TABLET | ORAL | Status: DC | PRN

## 2020-07-14 MED ORDER — OXYTOCIN BOLUS FROM INFUSION: 333.0000 mL | Freq: Once | INTRAVENOUS | Status: DC

## 2020-07-14 MED ORDER — LACTATED RINGERS IV SOLN: INTRAVENOUS | Status: DC

## 2020-07-14 MED ORDER — ONDANSETRON HCL 4 MG/2ML IJ SOLN: 4.0000 mg | Freq: Four times a day (QID) | INTRAMUSCULAR | Status: DC | PRN

## 2020-07-14 MED ORDER — FLEET ENEMA 7-19 GM/118ML RE ENEM: 1.0000 | ENEMA | Freq: Every day | RECTAL | Status: DC | PRN

## 2020-07-14 MED ORDER — OXYTOCIN-SODIUM CHLORIDE 30-0.9 UT/500ML-% IV SOLN: 2.5000 [IU]/h | INTRAVENOUS | Status: DC

## 2020-07-14 MED ORDER — DIPHENHYDRAMINE HCL 50 MG/ML IJ SOLN: 12.5000 mg | INTRAMUSCULAR | Status: DC | PRN

## 2020-07-14 MED ORDER — OXYCODONE-ACETAMINOPHEN 5-325 MG PO TABS: 2.0000 | ORAL_TABLET | ORAL | Status: DC | PRN

## 2020-07-14 MED ORDER — SOD CITRATE-CITRIC ACID 500-334 MG/5ML PO SOLN
30.0000 mL | ORAL | Status: DC | PRN
  Administered 2020-07-15: 30 mL via ORAL
  Filled 2020-07-14: qty 15

## 2020-07-14 MED ORDER — EPHEDRINE 5 MG/ML INJ
10.0000 mg | INTRAVENOUS | Status: DC | PRN
  Filled 2020-07-14: qty 2

## 2020-07-14 MED ORDER — EPHEDRINE 5 MG/ML INJ: 10.0000 mg | INTRAVENOUS | Status: DC | PRN

## 2020-07-14 NOTE — MAU Provider Note (Signed)
S: Ms. Grace Joseph is a 29 y.o. G2P1001 at [redacted]w[redacted]d  who presents to MAU today for labor evaluation. Nurse reports patient with desire for discharge, but EFM of concern.  Request provider to review.      Cervical exam by RN:  Dilation: 3 Effacement (%): 70 Cervical Position: Anterior Station: Ballotable Presentation: Undeterminable Exam by:: Leafy Ro, RN  Fetal Monitoring: Baseline: 145 Variability: Moderate Accelerations: One 10x10 Decelerations: Late x1, Variable x2 Contractions: Q6-26min  MDM Discussed patient with RN. NST reviewed.   A: SIUP at [redacted]w[redacted]d  False labor NST Nonreactive  P: -Nurse informed that NST not reactive and further monitoring is necessary. -Nurse informs patient and patient request AMA paperwork.  -Nurse returns and states patient would like to stay for further monitoring. -Will send for BPP.  Gerrit Heck, CNM 07/14/2020 4:32 PM  Reassessment (5:08 PM)  -BPP returns 8/8 -EFM with improvement: 135 bpm, Mod Var, -Decels, +Accels Toco: Ctx q7 min -Okay for discharge. -Labor precautions to be given by nurse. -Discharged to home in stable condition.  Grace Robins MSN, CNM Advanced Practice Provider, [facility] [employer]

## 2020-07-14 NOTE — MAU Note (Signed)
Patient changed her mind and decided to stay.  SO left room to go pick up other child.  CNM made aware and will order a BPP.

## 2020-07-14 NOTE — H&P (Addendum)
OB ADMISSION/ HISTORY & PHYSICAL:  Admission Date: 07/14/2020  9:37 PM  Admit Diagnosis: Normal labor [O80, Z37.9]    Grace Joseph is a 29 y.o. female presenting for contractions. Reports that contractions started last night but have become increasingly intense since 1500 this afternoon. Denies leaking of fluid or vaginal bleeding. Endorses + fetal movement. History of primary cesarean section for FTP in 2017. Reports she has not come to any prenatal visits since 9/8 due to a lapse in insurance. Husband at the bedside. Expecting a baby boy.   Prenatal History: G2P1001   EDC : 07/25/2020 Prenatal care at [facility] since 26 weeks   Prenatal course complicated by: 1. Late to [facility] - first visit at 26 weeks 2. Limited PNC, 2 visits total, last was 05/20/2020, pt states was due to lapse in insurance 3. Polyhydramnios noted at 26 weeks 4. History of PPH - 1300cc, transfused 2 units PP 5. History of primary cesarean section 2017, FTP, chorio, narrow outlet - reviewed OP note from 2017, states "WOULD NOT RECOMMEND A TOLAC IN THE FUTURE DUE TO THE NARROW PELVIS"  Prenatal Labs: ABO, Rh:   O POS Antibody: NEG (11/02 2232) Rubella:   Immune RPR:   Non-reactive HBsAg:   Negative HIV:   Negative GBS:   Unknown 1 hr Glucola : 103 Genetic Screening: Panorama low risk XY Ultrasound: normal anatomy, posterior placenta, AFI WNL, anatomy complete  TDAP: Declined FLU: Declined COVID-19: Declined    Maternal Diabetes: No Genetic Screening: Normal Maternal Ultrasounds/Referrals: Normal Fetal Ultrasounds or other Referrals:  None Maternal Substance Abuse:  No Significant Maternal Medications:  None Significant Maternal Lab Results:  None Other Comments:  Limited [facility]  Medical / Surgical History :  Past medical history:  Past Medical History:  Diagnosis Date  . Medical history non-contributory     Past surgical history:  Past Surgical History:  Procedure Laterality Date  . CESAREAN SECTION N/A  05/31/2016   Procedure: CESAREAN SECTION;  Surgeon: [place] Bing, MD;  Location: [facility] BIRTHING SUITES;  Service: Obstetrics;  Laterality: N/A;  . NO PAST SURGERIES      Family History:  Family History  Problem Relation Age of Onset  . Hypertension Mother     Social History:  reports that she has never smoked. She has never used smokeless tobacco. She reports previous alcohol use. She reports that she does not use drugs.  Allergies: Patient has no known allergies.   Current Medications at time of admission:  Medications Prior to Admission  Medication Sig Dispense Refill Last Dose  . Prenatal Vit-Fe Fumarate-FA (PRENATAL MULTIVITAMIN) TABS tablet Take 1 tablet by mouth daily at 12 noon.   07/14/2020 at Unknown time  . ferrous sulfate 325 (65 FE) MG tablet Take 1 tablet (325 mg total) by mouth 2 (two) times daily with a meal. 60 tablet 3   . Nutritional Supplements (JUICE PLUS FIBRE PO) Take by mouth.       Review of Systems: Review of Systems  All other systems reviewed and are negative.  Physical Exam: Vital signs and nursing notes reviewed.  Patient Vitals for the past 24 hrs:  BP Temp Temp src Pulse Resp SpO2 Height Weight  07/15/20 0036 -- -- -- -- -- 100 % -- --  07/15/20 0032 124/74 -- -- (!) 105 19 -- -- --  07/15/20 0031 -- -- -- -- -- 100 % -- --  07/15/20 0027 124/72 -- -- 100 17 -- -- --  07/15/20 0026 -- -- -- -- --  100 % -- --  07/15/20 0022 112/60 -- -- 92 18 -- -- --  07/15/20 0021 -- -- -- -- -- 100 % -- --  07/15/20 0017 111/72 -- -- (!) 104 17 -- -- --  07/15/20 0016 -- -- -- -- -- 100 % -- --  07/15/20 0011 109/67 -- -- (!) 102 15 100 % -- --  07/15/20 0007 (!) 97/48 -- -- (!) 108 16 -- -- --  07/15/20 0006 -- -- -- -- -- 100 % -- --  07/15/20 0002 101/76 -- -- (!) 103 18 -- -- --  07/15/20 0001 -- -- -- -- -- 99 % -- --  07/14/20 2358 130/67 -- -- (!) 131 15 -- -- --  07/14/20 2356 -- -- -- -- -- 100 % -- --  07/14/20 2351 -- -- -- -- -- 100 % -- --   07/14/20 2341 -- -- -- -- -- 100 % -- --  07/14/20 2336 -- -- -- -- -- 100 % -- --  07/14/20 2305 -- -- -- -- -- -- 5\' 3"  (1.6 m) 96.4 kg  07/14/20 2255 113/71 98.7 F (37.1 C) Oral 91 17 -- -- --  07/14/20 2146 116/63 98.2 F (36.8 C) Oral 98 20 -- 5\' 3"  (1.6 m) 96.4 kg    General: AAO x 3, NAD Heart: RRR Lungs:CTAB Abdomen: Gravid, NT, Leopold's vertex Extremities: no edema Genitalia / VE: Dilation: 5 Effacement (%): 80 Cervical Position: Middle Station: -3 Presentation: Vertex Exam by:: , RN   FHR: 130BPM, mod variability, + accels, no decels TOCO: Contractions q 2-5  Labs:   Pending T&S, CBC, RPR  Recent Labs    07/14/20 2225  WBC 7.7  HGB 13.0  HCT 39.5  PLT 189    Assessment:  29 y.o. G2P1001 at [redacted]w[redacted]d, TOLAC, limited PNC  1. Early labor, SVE from 3cm to 4cm 2. FHR category 1  3. GBS unknown 4. Desires epidural 5. Plans to breastfeed 6. History of PLTCS for FTP, narrow outlet  Plan:  1. Admit to BS 2. Routine L&D orders 3. Analgesia/anesthesia PRN  4. Expectant management 5. Patient declines augmentation of labor 6. GBS PCR 7. UDS 8. Guarded for VBAC  Discussed the R/B/A of TOLAC including the risk of uterine rupture (<1%), fetal injury or death, and/or excessive bleeding that requires a blood transfusion.   Patient verbalizes understanding of the risks of TOLAC and recommendation from previous surgeon against TOLAC. Patient desires to proceed with TOLAC.   Dr. 13/02/21 notified of admission / plan of care  [redacted]w[redacted]d CNM, MSN 07/15/2020, 12:40 AM   Upon pt's initial presentation and review of last op note and current efw by leopold, I asked June Leap to recommend c-section and patient refused despite counseling.  13/11/2019 said the pt said she didn't remember being told that a TOLAC is not recommended in the future due to narrow pelvis but it is clearly stated in the op note and it was discussed in the office with the patient as well at  one of the few visits she attended.  The patient had scant prenatal care and reported that it was because of insurance reasons.

## 2020-07-14 NOTE — MAU Note (Signed)
Presents with c/o ctxs every 5-10 minutes, began Sunday night.  Denies VB or LOF.  Reports +FM.

## 2020-07-14 NOTE — MAU Note (Signed)
PT SAYS UC STRONG SINCE LEFT 3 HRS AGO.  VE IN MAU - 3 CM .  DENIES HSV AND MRSA. GBS-  UNSURE

## 2020-07-14 NOTE — MAU Note (Signed)
Patient took herself off the monitors requesting to go home.  Provider reviewed tracing and patient was told we would like to monitor her baby a little longer and did not feel comfortable sending her home yet based off of her FHT.  Strip was reviewed with patient and her SO, but they still requested to leave.  Leaving AMA was explained to patient and patient and SO requested to sign papers.  Gerrit Heck, CNM, notified. Will bring papers in.

## 2020-07-14 NOTE — Discharge Instructions (Signed)
Fetal Movement Counts Patient Name: ________________________________________________ Patient Due Date: ____________________ What is a fetal movement count?  A fetal movement count is the number of times that you feel your baby move during a certain amount of time. This may also be called a fetal kick count. A fetal movement count is recommended for every pregnant woman. You may be asked to start counting fetal movements as early as week 28 of your pregnancy. Pay attention to when your baby is most active. You may notice your baby's sleep and wake cycles. You may also notice things that make your baby move more. You should do a fetal movement count:  When your baby is normally most active.  At the same time each day. A good time to count movements is while you are resting, after having something to eat and drink. How do I count fetal movements? 1. Find a quiet, comfortable area. Sit, or lie down on your side. 2. Write down the date, the start time and stop time, and the number of movements that you felt between those two times. Take this information with you to your health care visits. 3. Write down your start time when you feel the first movement. 4. Count kicks, flutters, swishes, rolls, and jabs. You should feel at least 10 movements. 5. You may stop counting after you have felt 10 movements, or if you have been counting for 2 hours. Write down the stop time. 6. If you do not feel 10 movements in 2 hours, contact your health care provider for further instructions. Your health care provider may want to do additional tests to assess your baby's well-being. Contact a health care provider if:  You feel fewer than 10 movements in 2 hours.  Your baby is not moving like he or she usually does. Date: ____________ Start time: ____________ Stop time: ____________ Movements: ____________ Date: ____________ Start time: ____________ Stop time: ____________ Movements: ____________ Date: ____________  Start time: ____________ Stop time: ____________ Movements: ____________ Date: ____________ Start time: ____________ Stop time: ____________ Movements: ____________ Date: ____________ Start time: ____________ Stop time: ____________ Movements: ____________ Date: ____________ Start time: ____________ Stop time: ____________ Movements: ____________ Date: ____________ Start time: ____________ Stop time: ____________ Movements: ____________ Date: ____________ Start time: ____________ Stop time: ____________ Movements: ____________ Date: ____________ Start time: ____________ Stop time: ____________ Movements: ____________ This information is not intended to replace advice given to you by your health care provider. Make sure you discuss any questions you have with your health care provider. Document Revised: 04/18/2019 Document Reviewed: 04/18/2019 Elsevier Patient Education  2020 [employer] Braxton Hicks Contractions Contractions of the uterus can occur throughout pregnancy, but they are not always a sign that you are in labor. You may have practice contractions called Braxton Hicks contractions. These false labor contractions are sometimes confused with true labor. What are Braxton Hicks contractions? Braxton Hicks contractions are tightening movements that occur in the muscles of the uterus before labor. Unlike true labor contractions, these contractions do not result in opening (dilation) and thinning of the cervix. Toward the end of pregnancy (32-34 weeks), Braxton Hicks contractions can happen more often and may become stronger. These contractions are sometimes difficult to tell apart from true labor because they can be very uncomfortable. You should not feel embarrassed if you go to the hospital with false labor. Sometimes, the only way to tell if you are in true labor is for your health care provider to look for changes in the cervix. The health care provider   will do a physical exam and may  monitor your contractions. If you are not in true labor, the exam should show that your cervix is not dilating and your water has not broken. If there are no other health problems associated with your pregnancy, it is completely safe for you to be sent home with false labor. You may continue to have Braxton Hicks contractions until you go into true labor. How to tell the difference between true labor and false labor True labor  Contractions last 30-70 seconds.  Contractions become very regular.  Discomfort is usually felt in the top of the uterus, and it spreads to the lower abdomen and low back.  Contractions do not go away with walking.  Contractions usually become more intense and increase in frequency.  The cervix dilates and gets thinner. False labor  Contractions are usually shorter and not as strong as true labor contractions.  Contractions are usually irregular.  Contractions are often felt in the front of the lower abdomen and in the groin.  Contractions may go away when you walk around or change positions while lying down.  Contractions get weaker and are shorter-lasting as time goes on.  The cervix usually does not dilate or become thin. Follow these instructions at home:   Take over-the-counter and prescription medicines only as told by your health care provider.  Keep up with your usual exercises and follow other instructions from your health care provider.  Eat and drink lightly if you think you are going into labor.  If Braxton Hicks contractions are making you uncomfortable: ? Change your position from lying down or resting to walking, or change from walking to resting. ? Sit and rest in a tub of warm water. ? Drink enough fluid to keep your urine pale yellow. Dehydration may cause these contractions. ? Do slow and deep breathing several times an hour.  Keep all follow-up prenatal visits as told by your health care provider. This is important. Contact a  health care provider if:  You have a fever.  You have continuous pain in your abdomen. Get help right away if:  Your contractions become stronger, more regular, and closer together.  You have fluid leaking or gushing from your vagina.  You pass blood-tinged mucus (bloody show).  You have bleeding from your vagina.  You have low back pain that you never had before.  You feel your baby's head pushing down and causing pelvic pressure.  Your baby is not moving inside you as much as it used to. Summary  Contractions that occur before labor are called Braxton Hicks contractions, false labor, or practice contractions.  Braxton Hicks contractions are usually shorter, weaker, farther apart, and less regular than true labor contractions. True labor contractions usually become progressively stronger and regular, and they become more frequent.  Manage discomfort from Braxton Hicks contractions by changing position, resting in a warm bath, drinking plenty of water, or practicing deep breathing. This information is not intended to replace advice given to you by your health care provider. Make sure you discuss any questions you have with your health care provider. Document Revised: 08/11/2017 Document Reviewed: 01/12/2017 Elsevier Patient Education  2020 [employer] Inc[employer]  

## 2020-07-14 NOTE — Anesthesia Preprocedure Evaluation (Addendum)
Anesthesia Evaluation  Patient identified by MRN, date of birth, ID band Patient awake    Reviewed: Allergy & Precautions, Patient's Chart, lab work & pertinent test results  Airway Mallampati: II  TM Distance: >3 FB Neck ROM: Full    Dental no notable dental hx. (+) Teeth Intact   Pulmonary neg pulmonary ROS,    Pulmonary exam normal breath sounds clear to auscultation       Cardiovascular negative cardio ROS Normal cardiovascular exam Rhythm:Regular Rate:Normal     Neuro/Psych negative neurological ROS  negative psych ROS   GI/Hepatic Neg liver ROS, GERD  ,  Endo/Other  Obesity  Renal/GU negative Renal ROS  negative genitourinary   Musculoskeletal negative musculoskeletal ROS (+)   Abdominal (+) + obese,   Peds  Hematology   Anesthesia Other Findings   Reproductive/Obstetrics (+) Pregnancy Hx/o previous C/Section FTP                            Anesthesia Physical Anesthesia Plan  ASA: II and emergent  Anesthesia Plan: Epidural   Post-op Pain Management:    Induction:   PONV Risk Score and Plan: 4 or greater and Scopolamine patch - Pre-op and Treatment may vary due to age or medical condition  Airway Management Planned: Natural Airway  Additional Equipment:   Intra-op Plan:   Post-operative Plan:   Informed Consent: I have reviewed the patients History and Physical, chart, labs and discussed the procedure including the risks, benefits and alternatives for the proposed anesthesia with the patient or authorized representative who has indicated his/her understanding and acceptance.     Dental advisory given  Plan Discussed with: Anesthesiologist and CRNA  Anesthesia Plan Comments: (Patient for C/Section for arrest of dilation. Will use epidural for C/Section. M. Malen Gauze, MD)       Anesthesia Quick Evaluation

## 2020-07-15 ENCOUNTER — Encounter (HOSPITAL_COMMUNITY)
Admission: AD | Disposition: A | Discharge status: Home / Self Care | Payer: Self-pay | Source: Home / Self Care | Attending: Obstetrics & Gynecology

## 2020-07-15 ENCOUNTER — Encounter (HOSPITAL_COMMUNITY): Payer: Self-pay | Admitting: Obstetrics and Gynecology

## 2020-07-15 LAB — RAPID URINE DRUG SCREEN, HOSP PERFORMED
Amphetamines: NOT DETECTED
Barbiturates: NOT DETECTED
Benzodiazepines: NOT DETECTED
Cocaine: NOT DETECTED
Opiates: NOT DETECTED
Tetrahydrocannabinol: NOT DETECTED

## 2020-07-15 LAB — GROUP B STREP BY PCR: Group B strep by PCR: POSITIVE — AB

## 2020-07-15 LAB — RESPIRATORY PANEL BY RT PCR (FLU A&B, COVID)
Influenza A by PCR: NEGATIVE
Influenza B by PCR: NEGATIVE
SARS Coronavirus 2 by RT PCR: NEGATIVE

## 2020-07-15 LAB — RPR: RPR Ser Ql: NONREACTIVE

## 2020-07-15 SURGERY — Surgical Case
Anesthesia: Epidural

## 2020-07-15 MED ORDER — CEFAZOLIN SODIUM-DEXTROSE 2-4 GM/100ML-% IV SOLN
INTRAVENOUS | Status: AC
  Filled 2020-07-15: qty 100

## 2020-07-15 MED ORDER — ONDANSETRON HCL 4 MG/2ML IJ SOLN
INTRAMUSCULAR | Status: AC
  Filled 2020-07-15: qty 4

## 2020-07-15 MED ORDER — SODIUM CHLORIDE 0.9 % IV SOLN
5.0000 10*6.[IU] | Freq: Once | INTRAVENOUS | Status: AC
  Administered 2020-07-15: 5 10*6.[IU] via INTRAVENOUS
  Filled 2020-07-15: qty 5

## 2020-07-15 MED ORDER — ACETAMINOPHEN 500 MG PO TABS
1000.0000 mg | ORAL_TABLET | Freq: Four times a day (QID) | ORAL | Status: DC | PRN
  Administered 2020-07-15: 1000 mg via ORAL
  Filled 2020-07-15: qty 2

## 2020-07-15 MED ORDER — PENICILLIN G POT IN DEXTROSE 60000 UNIT/ML IV SOLN: 3.0000 10*6.[IU] | INTRAVENOUS | Status: DC

## 2020-07-15 MED ORDER — EPINEPHRINE PF 1 MG/ML IJ SOLN
INTRAMUSCULAR | Status: AC
  Filled 2020-07-15: qty 1

## 2020-07-15 MED ORDER — TERBUTALINE SULFATE 1 MG/ML IJ SOLN: 0.2500 mg | Freq: Once | INTRAMUSCULAR | Status: DC | PRN

## 2020-07-15 MED ORDER — BUPIVACAINE HCL (PF) 0.25 % IJ SOLN
INTRAMUSCULAR | Status: AC
  Filled 2020-07-15: qty 30

## 2020-07-15 MED ORDER — LIDOCAINE HCL (PF) 1 % IJ SOLN
INTRAMUSCULAR | Status: DC | PRN
  Administered 2020-07-14 (×2): 4 mL via EPIDURAL

## 2020-07-15 MED ORDER — MORPHINE SULFATE (PF) 0.5 MG/ML IJ SOLN
INTRAMUSCULAR | Status: AC
  Filled 2020-07-15: qty 10

## 2020-07-15 MED ORDER — LACTATED RINGERS IV SOLN: INTRAVENOUS | Status: DC | PRN

## 2020-07-15 MED ORDER — SODIUM CHLORIDE 0.9 % IV SOLN: 5.0000 10*6.[IU] | Freq: Once | INTRAVENOUS | Status: DC

## 2020-07-15 MED ORDER — LIDOCAINE HCL (PF) 2 % IJ SOLN
INTRAMUSCULAR | Status: AC
  Filled 2020-07-15: qty 10

## 2020-07-15 MED ORDER — CEFAZOLIN SODIUM-DEXTROSE 2-3 GM-%(50ML) IV SOLR
INTRAVENOUS | Status: DC | PRN
  Administered 2020-07-15: 2 g via INTRAVENOUS

## 2020-07-15 MED ORDER — OXYTOCIN-SODIUM CHLORIDE 30-0.9 UT/500ML-% IV SOLN
1.0000 m[IU]/min | INTRAVENOUS | Status: DC
  Administered 2020-07-15 (×2): 2 m[IU]/min via INTRAVENOUS
  Filled 2020-07-15 (×2): qty 500

## 2020-07-15 MED ORDER — PENICILLIN G POT IN DEXTROSE 60000 UNIT/ML IV SOLN
3.0000 10*6.[IU] | INTRAVENOUS | Status: DC
  Administered 2020-07-15 (×2): 3 10*6.[IU] via INTRAVENOUS
  Filled 2020-07-15 (×2): qty 50

## 2020-07-15 MED ORDER — OXYTOCIN-SODIUM CHLORIDE 30-0.9 UT/500ML-% IV SOLN
INTRAVENOUS | Status: AC
  Filled 2020-07-15: qty 500

## 2020-07-15 MED ORDER — SODIUM CHLORIDE (PF) 0.9 % IJ SOLN
INTRAMUSCULAR | Status: DC | PRN
  Administered 2020-07-15: 12 mL/h via EPIDURAL

## 2020-07-15 MED ORDER — LIDOCAINE-EPINEPHRINE (PF) 2 %-1:200000 IJ SOLN
INTRAMUSCULAR | Status: DC | PRN
  Administered 2020-07-15 – 2020-07-16 (×4): 5 mL via EPIDURAL

## 2020-07-15 MED ORDER — SODIUM CHLORIDE 0.9 % IV SOLN
3.0000 g | Freq: Four times a day (QID) | INTRAVENOUS | Status: DC
  Administered 2020-07-15 – 2020-07-16 (×3): 3 g via INTRAVENOUS
  Filled 2020-07-15: qty 8
  Filled 2020-07-15: qty 3
  Filled 2020-07-15 (×3): qty 8
  Filled 2020-07-15: qty 3
  Filled 2020-07-15 (×4): qty 8

## 2020-07-15 MED ORDER — DEXAMETHASONE SODIUM PHOSPHATE 4 MG/ML IJ SOLN
INTRAMUSCULAR | Status: AC
  Filled 2020-07-15: qty 1

## 2020-07-15 SURGICAL SUPPLY — 50 items
BARRIER ADHS 3X4 INTERCEED (GAUZE/BANDAGES/DRESSINGS) ×3 IMPLANT
BENZOIN TINCTURE PRP APPL 2/3 (GAUZE/BANDAGES/DRESSINGS) ×3 IMPLANT
CHLORAPREP W/TINT 26ML (MISCELLANEOUS) ×3 IMPLANT
CLAMP CORD UMBIL (MISCELLANEOUS) IMPLANT
CLOSURE STERI STRIP 1/2 X4 (GAUZE/BANDAGES/DRESSINGS) ×2 IMPLANT
CLOSURE WOUND 1/2 X4 (GAUZE/BANDAGES/DRESSINGS) ×1
CLOTH BEACON ORANGE TIMEOUT ST (SAFETY) ×3 IMPLANT
DRSG OPSITE POSTOP 4X10 (GAUZE/BANDAGES/DRESSINGS) ×3 IMPLANT
ELECT REM PT RETURN 9FT ADLT (ELECTROSURGICAL) ×3
ELECTRODE REM PT RTRN 9FT ADLT (ELECTROSURGICAL) ×1 IMPLANT
EXTRACTOR VACUUM KIWI (MISCELLANEOUS) IMPLANT
FORMULA NB 0-3 ENFAMIL (FORMULA) ×3 IMPLANT
GAUZE SPONGE 4X4 12PLY STRL LF (GAUZE/BANDAGES/DRESSINGS) ×6 IMPLANT
GLOVE BIO SURGEON STRL SZ 6.5 (GLOVE) ×2 IMPLANT
GLOVE BIO SURGEONS STRL SZ 6.5 (GLOVE) ×1
GLOVE BIOGEL PI IND STRL 7.0 (GLOVE) ×2 IMPLANT
GLOVE BIOGEL PI INDICATOR 7.0 (GLOVE) ×4
GOWN STRL REUS W/TWL LRG LVL3 (GOWN DISPOSABLE) ×9 IMPLANT
HEMOSTAT ARISTA ABSORB 3G PWDR (HEMOSTASIS) ×3 IMPLANT
KIT ABG SYR 3ML LUER SLIP (SYRINGE) IMPLANT
NDL SAFETY ECLIPSE 18X1.5 (NEEDLE) ×1 IMPLANT
NEEDLE HYPO 18GX1.5 SHARP (NEEDLE) ×2
NEEDLE HYPO 22GX1.5 SAFETY (NEEDLE) ×3 IMPLANT
NEEDLE HYPO 25X5/8 SAFETYGLIDE (NEEDLE) ×6 IMPLANT
NS IRRIG 1000ML POUR BTL (IV SOLUTION) ×3 IMPLANT
PACK C SECTION WH (CUSTOM PROCEDURE TRAY) ×3 IMPLANT
PAD ABD 7.5X8 STRL (GAUZE/BANDAGES/DRESSINGS) ×3 IMPLANT
PAD OB MATERNITY 4.3X12.25 (PERSONAL CARE ITEMS) ×3 IMPLANT
PENCIL SMOKE EVAC W/HOLSTER (ELECTROSURGICAL) ×3 IMPLANT
RTRCTR C-SECT PINK 25CM LRG (MISCELLANEOUS) ×3 IMPLANT
SPONGE LAP 18X18 X RAY DECT (DISPOSABLE) ×12 IMPLANT
STRIP CLOSURE SKIN 1/2X4 (GAUZE/BANDAGES/DRESSINGS) ×2 IMPLANT
SUT CHROMIC 2 0 CT 1 (SUTURE) ×6 IMPLANT
SUT MNCRL 0 VIOLET CTX 36 (SUTURE) ×4 IMPLANT
SUT MON AB-0 CT1 36 (SUTURE) ×3 IMPLANT
SUT MONOCRYL 0 CTX 36 (SUTURE) ×8
SUT PDS AB 0 CTX 60 (SUTURE) ×3 IMPLANT
SUT PLAIN 2 0 (SUTURE)
SUT PLAIN ABS 2-0 CT1 27XMFL (SUTURE) IMPLANT
SUT VIC AB 0 CTX 36 (SUTURE) ×6
SUT VIC AB 0 CTX36XBRD ANBCTRL (SUTURE) ×3 IMPLANT
SUT VIC AB 4-0 KS 27 (SUTURE) ×3 IMPLANT
SYR 30ML LL (SYRINGE) ×3 IMPLANT
SYR 50ML LL SCALE MARK (SYRINGE) ×3 IMPLANT
SYR CONTROL 10ML LL (SYRINGE) ×3 IMPLANT
TOWEL OR 17X24 6PK STRL BLUE (TOWEL DISPOSABLE) ×3 IMPLANT
TRAY FOLEY W/BAG SLVR 14FR LF (SET/KITS/TRAYS/PACK) ×3 IMPLANT
WATER STERILE IRR 1000ML POUR (IV SOLUTION) ×3 IMPLANT
WATER STERILE IRR 1000ML UROMA (IV SOLUTION) ×3 IMPLANT
YANKAUER SUCT BULB TIP NO VENT (SUCTIONS) ×3 IMPLANT

## 2020-07-15 NOTE — Progress Notes (Signed)
Notified by RN of reoccurring late decels. Dr. Normand Sloop called and FHR surveillance reviewed. MD informed of recent discussion w/ pt and her refusal of a repeat C/S. Spouse tells RN that he is leaving for one hour and does not want his wife to be disturbed so that she may rest for one hour. Per the spouse, they have not made a decision for section.   Grace Pink, MSN, CNM 07/15/2020

## 2020-07-15 NOTE — Progress Notes (Signed)
Patient already in hands and knees position when RN entered the room. Educated the patient and family to ask for assistance prior to changing positions due to safety reasons.  Patient and support team seem to understand.

## 2020-07-15 NOTE — Anesthesia Procedure Notes (Signed)
Epidural Patient location during procedure: OB Start time: 07/14/2020 11:53 PM End time: 07/15/2020 12:01 AM  Staffing Anesthesiologist: Mal Amabile, MD Performed: anesthesiologist   Preanesthetic Checklist Completed: patient identified, IV checked, site marked, risks and benefits discussed, surgical consent, monitors and equipment checked, pre-op evaluation and timeout performed  Epidural Patient position: sitting Prep: DuraPrep and site prepped and draped Patient monitoring: continuous pulse ox and blood pressure Approach: midline Location: L3-L4 Injection technique: LOR air  Needle:  Needle type: Tuohy  Needle gauge: 17 G Needle length: 9 cm and 9 Needle insertion depth: 6 cm Catheter type: closed end flexible Catheter size: 19 Gauge Catheter at skin depth: 11 cm Test dose: negative and Other  Assessment Events: blood not aspirated, injection not painful, no injection resistance, no paresthesia and negative IV test  Additional Notes Patient identified. Risks and benefits discussed including failed block, incomplete  Pain control, post dural puncture headache, nerve damage, paralysis, blood pressure Changes, nausea, vomiting, reactions to medications-both toxic and allergic and post Partum back pain. All questions were answered. Patient expressed understanding and wished to proceed. Sterile technique was used throughout procedure. Epidural site was Dressed with sterile barrier dressing. No paresthesias, signs of intravascular injection Or signs of intrathecal spread were encountered.  Patient was more comfortable after the epidural was dosed. Please see RN's note for documentation of vital signs and FHR which are stable. Reason for block:procedure for pain

## 2020-07-15 NOTE — Progress Notes (Signed)
S: Comfortable with epidural. Discussed the R/B/A of AROM for augmentation and patient consents to procedure. Husband at the bedside.   O: Vitals:   07/15/20 0232 07/15/20 0302 07/15/20 0332 07/15/20 0401  BP: 113/72 118/73 113/71 111/71  Pulse: (!) 116 100 (!) 116 (!) 117  Resp: 17 16 14 16   Temp:      TempSrc:      SpO2:      Weight:      Height:       FHT:  FHR: 130 bpm, variability: moderate,  accelerations:  Present,  decelerations:  Absent UC:   regular, every 2-4 minutes SVE:   Dilation: 5 Effacement (%): 80 Station: -2 Exam by:: [email or web address] CNM  AROM of clear fluid at 0617  Results for orders placed or performed during the hospital encounter of 07/14/20 (from the past 24 hour(s))  Urinalysis, Routine w reflex microscopic Urine, Clean Catch     Status: Abnormal   Collection Time: 07/14/20 10:08 PM  Result Value Ref Range   Color, Urine YELLOW YELLOW   APPearance HAZY (A) CLEAR   Specific Gravity, Urine 1.018 1.005 - 1.030   pH 6.0 5.0 - 8.0   Glucose, UA NEGATIVE NEGATIVE mg/dL   Hgb urine dipstick NEGATIVE NEGATIVE   Bilirubin Urine NEGATIVE NEGATIVE   Ketones, ur NEGATIVE NEGATIVE mg/dL   Protein, ur NEGATIVE NEGATIVE mg/dL   Nitrite NEGATIVE NEGATIVE   Leukocytes,Ua NEGATIVE NEGATIVE  Respiratory Panel by RT PCR (Flu A&B, Covid) - Nasopharyngeal Swab     Status: None   Collection Time: 07/14/20 10:15 PM   Specimen: Nasopharyngeal Swab  Result Value Ref Range   SARS Coronavirus 2 by RT PCR NEGATIVE NEGATIVE   Influenza A by PCR NEGATIVE NEGATIVE   Influenza B by PCR NEGATIVE NEGATIVE  CBC     Status: None   Collection Time: 07/14/20 10:25 PM  Result Value Ref Range   WBC 7.7 4.0 - 10.5 K/uL   RBC 4.24 3.87 - 5.11 MIL/uL   Hemoglobin 13.0 12.0 - 15.0 g/dL   HCT 13/02/21 36 - 46 %   MCV 93.2 80.0 - 100.0 fL   MCH 30.7 26.0 - 34.0 pg   MCHC 32.9 30.0 - 36.0 g/dL   RDW 16.6 06.3 - 01.6 %   Platelets 189 150 - 400 K/uL   nRBC 0.0 0.0 - 0.2 %  Type and  screen [facility]     Status: None   Collection Time: 07/14/20 10:32 PM  Result Value Ref Range   ABO/RH(D) O POS    Antibody Screen NEG    Sample Expiration      07/17/2020,2359 Performed at [facility], [address] [place], [place]   Group B strep by PCR     Status: Abnormal   Collection Time: 07/14/20 11:10 PM   Specimen: Vaginal/Rectal; Genital  Result Value Ref Range   Group B strep by PCR POSITIVE (A) NEGATIVE  Urine rapid drug screen (hosp performed)     Status: None   Collection Time: 07/15/20  1:05 AM  Result Value Ref Range   Opiates NONE DETECTED NONE DETECTED   Cocaine NONE DETECTED NONE DETECTED   Benzodiazepines NONE DETECTED NONE DETECTED   Amphetamines NONE DETECTED NONE DETECTED   Tetrahydrocannabinol NONE DETECTED NONE DETECTED   Barbiturates NONE DETECTED NONE DETECTED   A / P: Spontaneous labor, TOLAC, limited PNC  Fetal Wellbeing:  Category I Pain Control:  Epidural GBS:  Positive, treated x 2 doses of PCN Anticipated MOD:  Working towards VBAC   Desires expectant management after AROM. Will recheck SVE in 4 hours. Open to Pitocin augmentation if needed.   Encourage frequent position changes to facilitate fetal rotation and descent.    Dr. Su Hilt updated on patient status and plan of care.   June Leap, CNM, MSN 07/15/2020, 6:28 AM

## 2020-07-15 NOTE — Progress Notes (Signed)
LATE ENTRY  2028- This RN entered the patients room and advised patient and family that I would be starting the pitocin. The patients sister began asking questions about the risks of restarting the pitocin. I advised the patient and family that the risks were the same as when it was first started. The patients sister then ask about the pitocin affecting the baby in a negative way. I explained to the patient and family that the medication wasn't affecting the baby negatively, it was the contractions that the medication caused that the baby had shown he couldn't tolerate. The patient and family became argumentative about the medication and its effects on the baby. At this time I exited the room and requested that Dr. Mora Appl come back into the room and explain to the patient and family how pitocin works. This was at 2030. Dr. Mora Appl entered the patient room and answered all of the families questions regarding the pitocin. At 2033 this RN started the pitocin. This RN and Dr. Mora Appl exited the room. At 2036 the FHR began a late decel. This RN, Dr. Mora Appl and a second RN entered the patients room. This RN advised the patient that the baby's heart rate was down and that she needed to be turned. At 2037 the patient was repositioned to her right side. At 2038 the pitocin was turned off. Dr. Mora Appl again advised the patient that it was her recommendation the patient consent to a c-section to preserve the fetus life and maternal life. The patient again refused the c-section.   Lovenia Shuck, RN

## 2020-07-15 NOTE — Progress Notes (Signed)
Called by RN to review fetal surveillance. Cat 2 tracing.Intra-uterine resuscitation in progress and late decels resolved. CNM to assess at bedside.   Rhea Pink, MSN, CNM 07/15/2020

## 2020-07-15 NOTE — Progress Notes (Signed)
Subjective:    Reviewed fetal surveillance at bedside w/ pt. Spouse, and pt's doula. Discussed significance of late decelerations and why the surveillance warrants concern. FHR improves temporarily w/ intra-uterine resuscitation including O2 via non-rebreather @ 10L, but returns to Cat 2 in the presence of interventions. Spouse concerned for bleeding risks associated w/ C/S. CNM discussed preventative measures to reduce the risk of bleeding. Answered several questions and supported shared-decision making. Reinforced the recommendation for delivery via rpt C/S. Pt, spouse, and sister ask for time to speak in private.   Objective:    VS: BP 128/87   Pulse (!) 110   Temp 98.4 F (36.9 C) (Oral)   Resp 18   Ht 5\' 3"  (1.6 m)   Wt 96.4 kg   SpO2 100%   BMI 37.64 kg/m  FHR : baseline 150 / variability moderate / accelerations none / late decelerations IUPC: contractions every 2-3 minutes / MVU 140 Membranes: AROM since 0617 Dilation: 6 Effacement (%): 70 Cervical Position: Middle Station: -1 Presentation: Vertex Exam by:: Terrilyn Tyner Pitocin off for late decels  Assessment/Plan:   29 y.o. G2P1001 [redacted]w[redacted]d TOLAC [place]     -afebrile after Tylenol and Unasym   Labor: protracted active phase, pt has been adequate  Preeclampsia:  N/A Fetal Wellbeing:  Category II Pain Control:  Epidural I/D:  GBS pos w/ adequate PCN prophylaxis Anticipated MOD:  suspicious for failed trial of labor  [redacted]w[redacted]d MSN, CNM 07/15/2020 4:51 PM

## 2020-07-15 NOTE — Progress Notes (Signed)
RN goes into room and tells pt that she must intervene with the fhr.  Pt and family have not made decision and fob is leaving for approx an hr.  He requests that pt have an hr to rest and that medical staff not interfere.  RN explained that she will do this to the best of her ability but must intervene with fhr appropriately.  RN also explained to pt that fhr interventions are no longer working and that she is concerned.  Pt and family state "OK." Rhea Pink updated on these events and cnm reports that Dr Normand Sloop is aware and has seen fhr tracing.

## 2020-07-15 NOTE — Progress Notes (Signed)
MD Progress Note  Spent more than 1 hour counseling patient, her spouse and her sister regarding her labor course.  Patient advised to proceed with a repeat cesarean section as her risk for post partum hemorrhage, fetal  Death, maternal morbidity and mortality, infection etc increases as the time progresses.  Patient counseled that I have done many vaginal births after cesarean section and her pelvis is android,  The fetus is occiput posterior and is does not tolerate uterine contractions.  Patient voiced that she has fear of a cesarean section since her experience with PPH with her first.  I advised her not to make a decision regarding this pregnancy out of fear, that my concern is her well-being and The well-being of the fetus.   Patient reports that she feels pressured and wants more time as she is 7cm. Patient counseled that she has been 6-7 cm for more than 12 hours and it is unlikely that she will have a successful vbac as the fetal vertex is -2 station.    I counseled patient that without contractions she will not have cervical change and we would have to re-start the pitocin.  The pitocin has been turned off since 5 pm due to repetitive late decelerations and fetal intolerance of labor.   As this note was being written, the pitocin was restarted at 44mU and there were two late decelerations.  Upon entering the room there was a deceleration nadir 75 lasting one minute and another nadir 95 lasting 1.5 minutes with return to baseline of 150.  Again, the mother was advised that with fetal intolerance and now Category III tracing my recommendation is to proceed with a repeat cesarean section.   She will consider, but still wants time to wait.   Naoma Diener Lacretia Tindall

## 2020-07-15 NOTE — Progress Notes (Signed)
MD Pre operative Note  Patient and family have decided to proceed with surgery  Procedure: Repeat cesarean delivery  Indication:  Failure to progress, fetal intolerance of labor   Discussed and reviewed risks of surgery with patient and family to include hemorrhage (possibly requiring a blood transfusion), cesarean hysterectomy as a life saving measure, fetal injury, fetal death, maternal death, infection, injury to adjacent organs ie bowel or bladder.  Patient voiced understanding and all inquiries were answered.   Consent signed, witnessed and placed into chart.   Grace Joseph [name]

## 2020-07-15 NOTE — Progress Notes (Signed)
Pt comfortable with epidural BP (!) 122/46   Pulse (!) 110   Temp (!) 101 F (38.3 C) (Axillary)   Resp 18   Ht 5\' 3"  (1.6 m)   Wt 96.4 kg   SpO2 100%   BMI 37.64 kg/m  cx unchanged Active labor with dystocia and chorioamnionitis.   Recommend cesarean section R&B reviewed Spoke with the pt , her husband and her doula They were told the risks are shoulder dystocia, acidosis of the baby and pp hemorhage They want to wait until 9pm tonight and then proceed if no progress.  They are open to supportive measures of oxygen and position changes.

## 2020-07-15 NOTE — Progress Notes (Signed)
Subjective:    Comfortable w/ epidural. Pt agreed to Pitocin and RN started it @ 0913. Discussed use of IUPC to determine titration of Pitocin and strength of ctx and pt agrees. Reviewed previous labor and birth. Pt state she did not advance past 7 cm after a serial IOL at 40 wks. Pt had a female infant @ 6# 13oz and perceives the weight of this female as larger than her last. Reports a total weight gain of 20# this preg.    Objective:    VS: BP 102/63   Pulse 90   Temp 98.3 F (36.8 C) (Oral)   Resp 20   Ht 5\' 3"  (1.6 m)   Wt 96.4 kg   SpO2 100%   BMI 37.64 kg/m  FHR : baseline 130 / variability moderate / accelerations 10x10 / variable decelerations IUPC: contractions every 2 minutes Membranes: AROM since 0617 Dilation: 7 Effacement (%): 80 Cervical Position: Middle Station: -1 Presentation: Vertex Exam by:: Win Guajardo Pitocin 2 mU/min  Assessment/Plan:   29 y.o. G2P1001 [redacted]w[redacted]d Prev C/S for FTP, desires TOLAC Late entry to [facility] and limited PNC  Labor: Progressing normally, IUPC placed, using peanut ball and changing positions Preeclampsia:  N/A Fetal Wellbeing:  Category II Pain Control:  Epidural I/D:  GBS pos, adequate treatment w/ PCN Anticipated MOD:  NSVD   [facility] MSN, CNM 07/15/2020 10:08 AM

## 2020-07-15 NOTE — Progress Notes (Signed)
MD Progress Note  Called to bedside by RN for minimal variability of fetal heart tracing and subtle late decelerations. Advised patient again that she should proceed with a repeat cesarean section.  She again, refused a cesarean section and asked if she could remain sleeping in bed until the morning when there was a "nice physician" on call.  The spouse inquired if there was another MD in the hospital that would be able to do the procedure.  Patient and family was counseled again that it was not advisable to wait until morning as the fetus is already showing signs of decreased "reserve" and oxygenation and that there is a risk of fetal death or grave morbidity.   Discussed case with RN charge Page Spiro and Dr. Erin Fulling.  The recommendation is to allow patient to proceed as she desires as performing a cesarean section would be assault without her consent.   Discussed with Dr. Wallace Keller who is the [facility] on call this evening, and advised him that if the patient fires [facility] as her primary providers would he be willing to perform her cesarean section if she desires and Dr. Wallace Keller agrees.   The patient and her family requested to be left alone while they conference together.   Naoma Diener Larsen Zettel

## 2020-07-15 NOTE — Progress Notes (Signed)
Notified by RN of temp of 101F. Telephone orders for Unasyn 3G Q 6 hr and Tylenol 1G PO now. CNM and Dr. Normand Sloop to assess at bedside.

## 2020-07-15 NOTE — Progress Notes (Signed)
Grace Joseph, cnm at bedside discussing POC with pt.  Gives recommendation for csection again and sits and explains risks of waiting longer before performing surgery.  Pt and family are concerned and ask many questions.  After about of discussion, they ask for a few minutes to talk alone without health care providers present.

## 2020-07-15 NOTE — Progress Notes (Signed)
Dr Normand Sloop notified by myself (unit charge RN) of patient non reassuring FHR tracing with repeatative late decels-provider expresses awareness of strip and same concern as direct care RN, Consulting civil engineer, and CNM and that she has remained available and ready to proceed with cesarean delivery if/when patient consents-provider has spoken with patient about shared concerns of the team however at this time patient and spouse are refusing recommended interventions

## 2020-07-15 NOTE — Progress Notes (Signed)
Grace Joseph, cnm asked about administering terbutaline.  Midwife believes at this time with pt and family concerned with bleeding that this would not be in the best interest of the patient.

## 2020-07-15 NOTE — Progress Notes (Signed)
Notified Dr. Mora Appl regarding FHR.  Notified of the recurrent late decelerations and minimal to none variability .  Educated the patient on the well being of the baby. Patient  wants to continue to position changes at this time .

## 2020-07-16 ENCOUNTER — Encounter (HOSPITAL_COMMUNITY): Payer: Self-pay | Admitting: Obstetrics and Gynecology

## 2020-07-16 ENCOUNTER — Inpatient Hospital Stay (HOSPITAL_COMMUNITY): Payer: Medicaid Other

## 2020-07-16 DIAGNOSIS — D62 Acute posthemorrhagic anemia: Secondary | ICD-10-CM | POA: Diagnosis not present

## 2020-07-16 LAB — COMPREHENSIVE METABOLIC PANEL
ALT: 12 U/L (ref 0–44)
ALT: 12 U/L (ref 0–44)
AST: 24 U/L (ref 15–41)
AST: 24 U/L (ref 15–41)
Albumin: 1.9 g/dL — ABNORMAL LOW (ref 3.5–5.0)
Albumin: 1.9 g/dL — ABNORMAL LOW (ref 3.5–5.0)
Alkaline Phosphatase: 90 U/L (ref 38–126)
Alkaline Phosphatase: 93 U/L (ref 38–126)
Anion gap: 9 (ref 5–15)
Anion gap: 9 (ref 5–15)
BUN: 17 mg/dL (ref 6–20)
BUN: 16 mg/dL (ref 6–20)
CO2: 19 mmol/L — ABNORMAL LOW (ref 22–32)
CO2: 19 mmol/L — ABNORMAL LOW (ref 22–32)
Calcium: 7.9 mg/dL — ABNORMAL LOW (ref 8.9–10.3)
Calcium: 8.1 mg/dL — ABNORMAL LOW (ref 8.9–10.3)
Chloride: 104 mmol/L (ref 98–111)
Chloride: 104 mmol/L (ref 98–111)
Creatinine, Ser: 2.05 mg/dL — ABNORMAL HIGH (ref 0.44–1.00)
Creatinine, Ser: 1.77 mg/dL — ABNORMAL HIGH (ref 0.44–1.00)
GFR, Estimated: 33 mL/min — ABNORMAL LOW (ref >60)
GFR, Estimated: 40 mL/min — ABNORMAL LOW (ref >60)
Glucose, Bld: 110 mg/dL — ABNORMAL HIGH (ref 70–99)
Glucose, Bld: 105 mg/dL — ABNORMAL HIGH (ref 70–99)
Potassium: 5.7 mmol/L — ABNORMAL HIGH (ref 3.5–5.1)
Potassium: 4.9 mmol/L (ref 3.5–5.1)
Sodium: 132 mmol/L — ABNORMAL LOW (ref 135–145)
Sodium: 132 mmol/L — ABNORMAL LOW (ref 135–145)
Total Bilirubin: 0.6 mg/dL (ref 0.3–1.2)
Total Bilirubin: 0.7 mg/dL (ref 0.3–1.2)
Total Protein: 4 g/dL — ABNORMAL LOW (ref 6.5–8.1)
Total Protein: 4 g/dL — ABNORMAL LOW (ref 6.5–8.1)

## 2020-07-16 LAB — CBC
HCT: 26.5 % — ABNORMAL LOW (ref 36.0–46.0)
HCT: 27.7 % — ABNORMAL LOW (ref 36.0–46.0)
Hemoglobin: 8.7 g/dL — ABNORMAL LOW (ref 12.0–15.0)
Hemoglobin: 9.2 g/dL — ABNORMAL LOW (ref 12.0–15.0)
MCH: 29.1 pg (ref 26.0–34.0)
MCH: 28.8 pg (ref 26.0–34.0)
MCHC: 32.8 g/dL (ref 30.0–36.0)
MCHC: 33.2 g/dL (ref 30.0–36.0)
MCV: 88.6 fL (ref 80.0–100.0)
MCV: 86.6 fL (ref 80.0–100.0)
Platelets: 133 10*3/uL — ABNORMAL LOW (ref 150–400)
Platelets: 120 10*3/uL — ABNORMAL LOW (ref 150–400)
RBC: 2.99 MIL/uL — ABNORMAL LOW (ref 3.87–5.11)
RBC: 3.2 MIL/uL — ABNORMAL LOW (ref 3.87–5.11)
RDW: 14.9 % (ref 11.5–15.5)
RDW: 15 % (ref 11.5–15.5)
WBC: 23.7 10*3/uL — ABNORMAL HIGH (ref 4.0–10.5)
WBC: 21.4 10*3/uL — ABNORMAL HIGH (ref 4.0–10.5)
nRBC: 0 % (ref 0.0–0.2)
nRBC: 0 % (ref 0.0–0.2)

## 2020-07-16 LAB — CBC WITH DIFFERENTIAL/PLATELET
Abs Immature Granulocytes: 0.19 10*3/uL — ABNORMAL HIGH (ref 0.00–0.07)
Abs Immature Granulocytes: 0.19 10*3/uL — ABNORMAL HIGH (ref 0.00–0.07)
Abs Immature Granulocytes: 0.08 10*3/uL — ABNORMAL HIGH (ref 0.00–0.07)
Abs Immature Granulocytes: 0.07 K/uL (ref 0.00–0.07)
Basophils Absolute: 0 10*3/uL (ref 0.0–0.1)
Basophils Absolute: 0 10*3/uL (ref 0.0–0.1)
Basophils Absolute: 0 10*3/uL (ref 0.0–0.1)
Basophils Absolute: 0 K/uL (ref 0.0–0.1)
Basophils Relative: 0 %
Basophils Relative: 0 %
Basophils Relative: 0 %
Basophils Relative: 0 %
Eosinophils Absolute: 0 10*3/uL (ref 0.0–0.5)
Eosinophils Absolute: 0 10*3/uL (ref 0.0–0.5)
Eosinophils Absolute: 0 10*3/uL (ref 0.0–0.5)
Eosinophils Absolute: 0 K/uL (ref 0.0–0.5)
Eosinophils Relative: 0 %
Eosinophils Relative: 0 %
Eosinophils Relative: 0 %
Eosinophils Relative: 0 %
HCT: 19.6 % — ABNORMAL LOW (ref 36.0–46.0)
HCT: 26 % — ABNORMAL LOW (ref 36.0–46.0)
HCT: 28.1 % — ABNORMAL LOW (ref 36.0–46.0)
HCT: 25.9 % — ABNORMAL LOW (ref 36.0–46.0)
Hemoglobin: 6.4 g/dL — CL (ref 12.0–15.0)
Hemoglobin: 8.7 g/dL — ABNORMAL LOW (ref 12.0–15.0)
Hemoglobin: 9.6 g/dL — ABNORMAL LOW (ref 12.0–15.0)
Hemoglobin: 9 g/dL — ABNORMAL LOW (ref 12.0–15.0)
Immature Granulocytes: 1 %
Immature Granulocytes: 1 %
Immature Granulocytes: 0 %
Immature Granulocytes: 0 %
Lymphocytes Relative: 4 %
Lymphocytes Relative: 5 %
Lymphocytes Relative: 8 %
Lymphocytes Relative: 8 %
Lymphs Abs: 0.9 10*3/uL (ref 0.7–4.0)
Lymphs Abs: 1.2 10*3/uL (ref 0.7–4.0)
Lymphs Abs: 1.5 10*3/uL (ref 0.7–4.0)
Lymphs Abs: 1.3 K/uL (ref 0.7–4.0)
MCH: 30.8 pg (ref 26.0–34.0)
MCH: 29.7 pg (ref 26.0–34.0)
MCH: 28.8 pg (ref 26.0–34.0)
MCH: 29 pg (ref 26.0–34.0)
MCHC: 32.7 g/dL (ref 30.0–36.0)
MCHC: 33.5 g/dL (ref 30.0–36.0)
MCHC: 34.2 g/dL (ref 30.0–36.0)
MCHC: 34.7 g/dL (ref 30.0–36.0)
MCV: 94.2 fL (ref 80.0–100.0)
MCV: 88.7 fL (ref 80.0–100.0)
MCV: 84.4 fL (ref 80.0–100.0)
MCV: 83.5 fL (ref 80.0–100.0)
Monocytes Absolute: 1.5 10*3/uL — ABNORMAL HIGH (ref 0.1–1.0)
Monocytes Absolute: 1.7 10*3/uL — ABNORMAL HIGH (ref 0.1–1.0)
Monocytes Absolute: 1.6 10*3/uL — ABNORMAL HIGH (ref 0.1–1.0)
Monocytes Absolute: 1.4 K/uL — ABNORMAL HIGH (ref 0.1–1.0)
Monocytes Relative: 7 %
Monocytes Relative: 7 %
Monocytes Relative: 8 %
Monocytes Relative: 9 %
Neutro Abs: 20.9 10*3/uL — ABNORMAL HIGH (ref 1.7–7.7)
Neutro Abs: 23 10*3/uL — ABNORMAL HIGH (ref 1.7–7.7)
Neutro Abs: 15.6 10*3/uL — ABNORMAL HIGH (ref 1.7–7.7)
Neutro Abs: 12.9 K/uL — ABNORMAL HIGH (ref 1.7–7.7)
Neutrophils Relative %: 88 %
Neutrophils Relative %: 87 %
Neutrophils Relative %: 84 %
Neutrophils Relative %: 83 %
Platelets: 169 10*3/uL (ref 150–400)
Platelets: 153 10*3/uL (ref 150–400)
Platelets: 109 10*3/uL — ABNORMAL LOW (ref 150–400)
Platelets: 96 K/uL — ABNORMAL LOW (ref 150–400)
RBC: 2.08 MIL/uL — ABNORMAL LOW (ref 3.87–5.11)
RBC: 2.93 MIL/uL — ABNORMAL LOW (ref 3.87–5.11)
RBC: 3.33 MIL/uL — ABNORMAL LOW (ref 3.87–5.11)
RBC: 3.1 MIL/uL — ABNORMAL LOW (ref 3.87–5.11)
RDW: 13.3 % (ref 11.5–15.5)
RDW: 15 % (ref 11.5–15.5)
RDW: 15.1 % (ref 11.5–15.5)
RDW: 15.3 % (ref 11.5–15.5)
WBC: 23.6 10*3/uL — ABNORMAL HIGH (ref 4.0–10.5)
WBC: 26.2 10*3/uL — ABNORMAL HIGH (ref 4.0–10.5)
WBC: 18.8 10*3/uL — ABNORMAL HIGH (ref 4.0–10.5)
WBC: 15.6 K/uL — ABNORMAL HIGH (ref 4.0–10.5)
nRBC: 0 % (ref 0.0–0.2)
nRBC: 0 % (ref 0.0–0.2)
nRBC: 0 % (ref 0.0–0.2)
nRBC: 0 % (ref 0.0–0.2)

## 2020-07-16 LAB — FIBRINOGEN
Fibrinogen: 363 mg/dL (ref 210–475)
Fibrinogen: 518 mg/dL — ABNORMAL HIGH (ref 210–475)
Fibrinogen: 539 mg/dL — ABNORMAL HIGH (ref 210–475)

## 2020-07-16 LAB — APTT
aPTT: 28 seconds (ref 24–36)
aPTT: 30 s (ref 24–36)
aPTT: 31 s (ref 24–36)

## 2020-07-16 LAB — PROTIME-INR
INR: 1.2 (ref 0.8–1.2)
INR: 1.2 (ref 0.8–1.2)
INR: 1.2 (ref 0.8–1.2)
Prothrombin Time: 15.1 seconds (ref 11.4–15.2)
Prothrombin Time: 14.5 s (ref 11.4–15.2)
Prothrombin Time: 14.6 s (ref 11.4–15.2)

## 2020-07-16 LAB — PREPARE RBC (CROSSMATCH)

## 2020-07-16 MED ORDER — NALBUPHINE HCL 10 MG/ML IJ SOLN: 5.0000 mg | INTRAMUSCULAR | Status: DC | PRN

## 2020-07-16 MED ORDER — KETOROLAC TROMETHAMINE 30 MG/ML IJ SOLN: 30.0000 mg | Freq: Four times a day (QID) | INTRAMUSCULAR | Status: AC | PRN

## 2020-07-16 MED ORDER — DEXAMETHASONE SODIUM PHOSPHATE 4 MG/ML IJ SOLN
INTRAMUSCULAR | Status: DC | PRN
  Administered 2020-07-16: 4 mg via INTRAVENOUS

## 2020-07-16 MED ORDER — METHYLERGONOVINE MALEATE 0.2 MG/ML IJ SOLN: 0.2000 mg | INTRAMUSCULAR | Status: DC | PRN

## 2020-07-16 MED ORDER — IBUPROFEN 800 MG PO TABS
800.0000 mg | ORAL_TABLET | Freq: Three times a day (TID) | ORAL | Status: DC
  Administered 2020-07-16 – 2020-07-19 (×9): 800 mg via ORAL
  Filled 2020-07-16 (×10): qty 1

## 2020-07-16 MED ORDER — WITCH HAZEL-GLYCERIN EX PADS: 1.0000 "application " | MEDICATED_PAD | CUTANEOUS | Status: DC | PRN

## 2020-07-16 MED ORDER — ONDANSETRON HCL 4 MG/2ML IJ SOLN: 4.0000 mg | Freq: Three times a day (TID) | INTRAMUSCULAR | Status: DC | PRN

## 2020-07-16 MED ORDER — MEPERIDINE HCL 25 MG/ML IJ SOLN: 6.2500 mg | INTRAMUSCULAR | Status: DC | PRN

## 2020-07-16 MED ORDER — OXYCODONE HCL 5 MG PO TABS: 5.0000 mg | ORAL_TABLET | ORAL | Status: DC | PRN

## 2020-07-16 MED ORDER — SODIUM CHLORIDE 0.9% IV SOLUTION: Freq: Once | INTRAVENOUS | Status: DC

## 2020-07-16 MED ORDER — NALBUPHINE HCL 10 MG/ML IJ SOLN: 5.0000 mg | Freq: Once | INTRAMUSCULAR | Status: DC | PRN

## 2020-07-16 MED ORDER — ONDANSETRON HCL 4 MG/2ML IJ SOLN
INTRAMUSCULAR | Status: DC | PRN
  Administered 2020-07-16: 4 mg via INTRAVENOUS

## 2020-07-16 MED ORDER — MENTHOL 3 MG MT LOZG: 1.0000 | LOZENGE | OROMUCOSAL | Status: DC | PRN

## 2020-07-16 MED ORDER — SODIUM CHLORIDE 0.9% FLUSH: 3.0000 mL | INTRAVENOUS | Status: DC | PRN

## 2020-07-16 MED ORDER — NALOXONE HCL 4 MG/10ML IJ SOLN
1.0000 ug/kg/h | INTRAVENOUS | Status: DC | PRN
  Filled 2020-07-16: qty 5

## 2020-07-16 MED ORDER — ACETAMINOPHEN 10 MG/ML IV SOLN
INTRAVENOUS | Status: DC | PRN
  Administered 2020-07-16: 1000 mg via INTRAVENOUS

## 2020-07-16 MED ORDER — DIPHENHYDRAMINE HCL 25 MG PO CAPS: 25.0000 mg | ORAL_CAPSULE | Freq: Four times a day (QID) | ORAL | Status: DC | PRN

## 2020-07-16 MED ORDER — SIMETHICONE 80 MG PO CHEW
80.0000 mg | CHEWABLE_TABLET | Freq: Three times a day (TID) | ORAL | Status: DC
  Administered 2020-07-17 – 2020-07-19 (×6): 80 mg via ORAL
  Filled 2020-07-16 (×5): qty 1

## 2020-07-16 MED ORDER — SODIUM CHLORIDE 0.9 % IR SOLN
Status: DC | PRN
  Administered 2020-07-16 (×4): 1

## 2020-07-16 MED ORDER — DIBUCAINE (PERIANAL) 1 % EX OINT: 1.0000 "application " | TOPICAL_OINTMENT | CUTANEOUS | Status: DC | PRN

## 2020-07-16 MED ORDER — STERILE WATER FOR IRRIGATION IR SOLN
Status: DC | PRN
  Administered 2020-07-16: 1

## 2020-07-16 MED ORDER — LIDOCAINE HCL 1 % IJ SOLN
INTRAMUSCULAR | Status: AC
  Filled 2020-07-16: qty 20

## 2020-07-16 MED ORDER — COCONUT OIL OIL: 1.0000 "application " | TOPICAL_OIL | Status: DC | PRN

## 2020-07-16 MED ORDER — NALOXONE HCL 0.4 MG/ML IJ SOLN: 0.4000 mg | INTRAMUSCULAR | Status: DC | PRN

## 2020-07-16 MED ORDER — TETANUS-DIPHTH-ACELL PERTUSSIS 5-2.5-18.5 LF-MCG/0.5 IM SUSY: 0.5000 mL | PREFILLED_SYRINGE | Freq: Once | INTRAMUSCULAR | Status: DC

## 2020-07-16 MED ORDER — SIMETHICONE 80 MG PO CHEW
80.0000 mg | CHEWABLE_TABLET | ORAL | Status: DC
  Administered 2020-07-16 – 2020-07-17 (×2): 80 mg via ORAL
  Filled 2020-07-16 (×2): qty 1

## 2020-07-16 MED ORDER — OXYTOCIN-SODIUM CHLORIDE 30-0.9 UT/500ML-% IV SOLN: 2.5000 [IU]/h | INTRAVENOUS | Status: AC

## 2020-07-16 MED ORDER — METHYLERGONOVINE MALEATE 0.2 MG/ML IJ SOLN
INTRAMUSCULAR | Status: DC | PRN
  Administered 2020-07-16: .2 mg via INTRAMUSCULAR

## 2020-07-16 MED ORDER — FENTANYL CITRATE (PF) 100 MCG/2ML IJ SOLN
INTRAMUSCULAR | Status: DC | PRN
Start: 2020-07-16 — End: 2020-07-16
  Administered 2020-07-16: 100 ug via INTRAVENOUS

## 2020-07-16 MED ORDER — METHYLERGONOVINE MALEATE 0.2 MG/ML IJ SOLN
INTRAMUSCULAR | Status: AC
  Filled 2020-07-16: qty 1

## 2020-07-16 MED ORDER — LACTATED RINGERS IV SOLN: INTRAVENOUS | Status: DC

## 2020-07-16 MED ORDER — SENNOSIDES-DOCUSATE SODIUM 8.6-50 MG PO TABS
2.0000 | ORAL_TABLET | ORAL | Status: DC
  Administered 2020-07-16 – 2020-07-17 (×2): 2 via ORAL
  Filled 2020-07-16 (×2): qty 2

## 2020-07-16 MED ORDER — FENTANYL CITRATE (PF) 100 MCG/2ML IJ SOLN
INTRAMUSCULAR | Status: AC
  Filled 2020-07-16: qty 2

## 2020-07-16 MED ORDER — ALBUMIN HUMAN 5 % IV SOLN
INTRAVENOUS | Status: AC
  Filled 2020-07-16: qty 250

## 2020-07-16 MED ORDER — IOHEXOL 350 MG/ML SOLN
75.0000 mL | Freq: Once | INTRAVENOUS | Status: AC | PRN
  Administered 2020-07-16: 75 mL via INTRAVENOUS

## 2020-07-16 MED ORDER — PRENATAL MULTIVITAMIN CH
1.0000 | ORAL_TABLET | Freq: Every day | ORAL | Status: DC
  Administered 2020-07-18 – 2020-07-19 (×2): 1 via ORAL
  Filled 2020-07-16 (×3): qty 1

## 2020-07-16 MED ORDER — TRANEXAMIC ACID-NACL 1000-0.7 MG/100ML-% IV SOLN
INTRAVENOUS | Status: DC | PRN
  Administered 2020-07-16: 1000 mg via INTRAVENOUS

## 2020-07-16 MED ORDER — LACTATED RINGERS IV BOLUS
500.0000 mL | Freq: Once | INTRAVENOUS | Status: AC
  Administered 2020-07-16: 500 mL via INTRAVENOUS

## 2020-07-16 MED ORDER — ACETAMINOPHEN 500 MG PO TABS
1000.0000 mg | ORAL_TABLET | Freq: Three times a day (TID) | ORAL | Status: DC
  Administered 2020-07-16 – 2020-07-19 (×9): 1000 mg via ORAL
  Filled 2020-07-16 (×10): qty 2

## 2020-07-16 MED ORDER — BUPIVACAINE HCL 0.5 % IJ SOLN
INTRAMUSCULAR | Status: DC | PRN
  Administered 2020-07-16: 30 mL

## 2020-07-16 MED ORDER — SIMETHICONE 80 MG PO CHEW: 80.0000 mg | CHEWABLE_TABLET | ORAL | Status: DC | PRN

## 2020-07-16 MED ORDER — ACETAMINOPHEN 10 MG/ML IV SOLN
INTRAVENOUS | Status: AC
  Filled 2020-07-16: qty 100

## 2020-07-16 MED ORDER — DIPHENHYDRAMINE HCL 50 MG/ML IJ SOLN: 12.5000 mg | INTRAMUSCULAR | Status: DC | PRN

## 2020-07-16 MED ORDER — TRANEXAMIC ACID-NACL 1000-0.7 MG/100ML-% IV SOLN
INTRAVENOUS | Status: AC
  Filled 2020-07-16: qty 100

## 2020-07-16 MED ORDER — MORPHINE SULFATE (PF) 0.5 MG/ML IJ SOLN
INTRAMUSCULAR | Status: DC | PRN
  Administered 2020-07-16: 3 mg via EPIDURAL

## 2020-07-16 MED ORDER — FENTANYL CITRATE (PF) 100 MCG/2ML IJ SOLN
25.0000 ug | INTRAMUSCULAR | Status: DC | PRN
  Administered 2020-07-16: 50 ug via INTRAVENOUS

## 2020-07-16 MED ORDER — OXYTOCIN-SODIUM CHLORIDE 30-0.9 UT/500ML-% IV SOLN
INTRAVENOUS | Status: DC | PRN
  Administered 2020-07-15: 200 mL via INTRAVENOUS
  Administered 2020-07-16 (×2): 100 mL via INTRAVENOUS

## 2020-07-16 MED ORDER — DIPHENHYDRAMINE HCL 25 MG PO CAPS: 25.0000 mg | ORAL_CAPSULE | ORAL | Status: DC | PRN

## 2020-07-16 MED ORDER — ALBUMIN HUMAN 5 % IV SOLN: INTRAVENOUS | Status: DC | PRN

## 2020-07-16 MED ORDER — METHYLERGONOVINE MALEATE 0.2 MG PO TABS: 0.2000 mg | ORAL_TABLET | ORAL | Status: DC | PRN

## 2020-07-16 NOTE — Progress Notes (Signed)
Contacted Rhea Pink, CNM regarding patient distended abdomen.  Rhea Pink, CNM will come to evaluate in person.

## 2020-07-16 NOTE — Progress Notes (Addendum)
CRITICAL VALUE ALERT  Critical Value: 6.4 hgb  Date & Time Notied:  07/16/20 @0353   Provider Notified: dr foster  Orders Received/Actions taken:  Transfuse 2u RBC

## 2020-07-16 NOTE — Progress Notes (Addendum)
To room for patient assessment. Dr. Connye Burkitt present at bedside for evaluation  Post-op day 1.  S/P 4U PRBC for symptomatic acute blood loss anemia Hgb 13.0 pre-op to 6.5 post- 9.0 CBCD, PT, PTT, INR, Fibrinogen pending q 4 hours S/P shows ileus, hematoma, and fluid   S: Denies abd pain centrally, reports bilateral pain on sides. Overall tolerable  O: Vitals:   07/16/20 1550 07/16/20 1617 07/16/20 1716 07/16/20 1950  BP: 105/73 101/64 115/73 112/68  Pulse: (!) 127 (!) 130 (!) 146   Temp: (!) 97.3 F (36.3 C) 97.9 F (36.6 C)  98.8 F (37.1 C)  Resp: 18 18  18   Height:      Weight:      SpO2: 100% 100%  100%  TempSrc: Oral Oral  Oral  BMI (Calculated):        Intake/Output Summary (Last 24 hours) at 07/16/2020 2211 Last data filed at 07/16/2020 2020 Gross per 24 hour  Intake 4489.37 ml  Output 3301 ml  Net 1188.37 ml    CBC    Component Value Date/Time   WBC 15.6 (H) 07/16/2020 1924   RBC 3.10 (L) 07/16/2020 1924   HGB 9.0 (L) 07/16/2020 1924   HCT 25.9 (L) 07/16/2020 1924   PLT 96 (L) 07/16/2020 1924   MCV 83.5 07/16/2020 1924   MCH 29.0 07/16/2020 1924   MCHC 34.7 07/16/2020 1924   RDW 15.3 07/16/2020 1924   LYMPHSABS 1.3 07/16/2020 1924   MONOABS 1.4 (H) 07/16/2020 1924   EOSABS 0.0 07/16/2020 1924   BASOSABS 0.0 07/16/2020 1924   Physical Exam: Gen: AAO x3. Abd: distended, soft centrally, tender bilaterally on sides.  GU: scant rubra, no clots  A/P: Post-op day #1 for Repeat C/S Chorioamnionitis    -Unasyn complete PPH    -QBL 1551 ml    -S/P TXA and methergine    -continue methergine serios PP Acute blood loss anemia     -S/P transfusion PRBC x4     -labs pending q 4 hours Dr. 13/12/2019 present at bedside. Pt to remain NPO except ice chips until am. Follow labs, VS, I&Os  Connye Burkitt, MSN, CNM 07/16/2020 10:11 PM

## 2020-07-16 NOTE — Anesthesia Postprocedure Evaluation (Addendum)
Anesthesia Post Note  Patient: Grace Joseph  Procedure(s) Performed: CESAREAN SECTION (N/A )     Patient location during evaluation: PACU Anesthesia Type: Epidural Level of consciousness: oriented and awake and alert Pain management: pain level controlled Vital Signs Assessment: post-procedure vital signs reviewed and stable Respiratory status: spontaneous breathing, respiratory function stable and nonlabored ventilation Cardiovascular status: blood pressure returned to baseline, stable and tachycardic Postop Assessment: no headache, no backache, no apparent nausea or vomiting, epidural receding and patient able to bend at knees Anesthetic complications: no Comments: Patient was severely anemic in PACU and given 2 units of PRBC's. She is currently stable for transfer to floor.   No complications documented.  Last Vitals:  Vitals:   07/15/20 2200 07/15/20 2230  BP: 123/76 (!) 117/59  Pulse: (!) 125 (!) 105  Resp: 18 16  Temp:    SpO2:  100%    Last Pain:  Vitals:   07/15/20 2148  TempSrc: Oral  PainSc:    Pain Goal:                Epidural/Spinal Function Cutaneous sensation: Tingles (07/16/20 0130), Patient able to flex knees: Yes (07/16/20 0130), Patient able to lift hips off bed: No (07/16/20 0130), Back pain beyond tenderness at insertion site: No (07/16/20 0130), Progressively worsening motor and/or sensory loss: No (07/16/20 0130), Bowel and/or bladder incontinence post epidural: No (07/16/20 0130)  Franklin Clapsaddle A.

## 2020-07-16 NOTE — Progress Notes (Signed)
Colleen Rhymers, CRNA at the bedside in regards to patient vitals signs.

## 2020-07-16 NOTE — Clinical Social Work Maternal (Signed)
CLINICAL SOCIAL WORK MATERNAL/CHILD NOTE  Patient Details  Name: Grace Joseph MRN: [id number] Date of Birth: 02/09/1991  Date:  07/16/2020  Clinical Social Worker Initiating Note:  Jacee Enerson, LCSWA Date/Time: Initiated:  07/16/20/0900     Child's Name:  Trei Lynch   Biological Parents:  Mother, Father   Need for Interpreter:  None   Reason for Referral:  Late or No Prenatal Care    Address:  [address] [place] [address]    Phone number:  [phone] (home)     Additional phone number:   Household Members/Support Persons (HM/SP):   Household Member/Support Person 1, Household Member/Support Person 2   HM/SP Name Relationship DOB or Age  HM/SP -1 Travon Lynch Significant Other 02/27/1993  HM/SP -2 Tristen Lynch Son 05/31/2016  HM/SP -3        HM/SP -4        HM/SP -5        HM/SP -6        HM/SP -7        HM/SP -8          Natural Supports (not living in the home):  Friends, Immediate Family   Professional Supports: None   Employment: Unemployed   Type of Work:     Education:  College graduate   Homebound arranged:    Financial Resources:  Medicaid   Other Resources:      Cultural/Religious Considerations Which May Impact Care:    Strengths:  Ability to meet basic needs , Pediatrician chosen, Home prepared for child    Psychotropic Medications:         Pediatrician:    [place] area  Pediatrician List:    [facility]  [place]    [place]    [place]    [place]    [place]      Pediatrician Fax Number:    Risk Factors/Current Problems:  None   Cognitive State:  Linear Thinking , Alert    Mood/Affect:  Calm , Happy    CSW Assessment: CSW consulted for late & limited prenatal care. CSW met with MOB to offer support and complete assessment. CSW entered room and observed FOB sleeping on couch and newborn absent from room. CSW introduced self and role to MOB. MOB stated  newborn was taken to nursery to warm-up and provided permission for CSW to complete assessment with FOB present in room. MOB resides with FOB and other child Tristen. MOB is unemployed and has been denied food stamp services. MOB expressed she is not interested in WIC. MOB denies any mental health history and stated she currently is not experiencing any SI, HI or DV. MOB stated she is currently tired but otherwise feeling okay. MOB identified her sister and close friends as supports.   MOB stated she received late prenatal care due to insurance lapsing. MOB stated she applied for medicaid and was approved two weeks ago. CSW informed MOB of hospital drug screen policy regarding late/limited prenatal care. CSW informed MOB an UDS and CDS will be completed on baby. CSW informed MOB a CPS report will be required if either screen results positive for substances. MOB stated she did not use any substances during pregnancy. MOB denies any current or previous CPS history. MOB expressed no further questions or concerns regarding drug screen policy.   CSW provided education regarding the baby blues period vs. perinatal mood disorders, discussed treatment and gave resources for mental health follow up if   concerns arise.  CSW recommends self-evaluation during the postpartum time period using the New Mom Checklist from Postpartum Progress and encouraged MOB to contact a medical professional if symptoms are noted at any time. MOB denies any further barriers regarding receiving follow-up care.   CSW provided review of Sudden Infant Death Syndrome (SIDS) precautions.  MOB stated baby will sleep in a packn'play & bassinet. MOB stated they have all of the essential needs for baby, including a new carseat. Baby will receive follow-up care at [facility]. MOB denies any transportation barriers.   CSW will continue to follow UDS/CDS and make CPS report if warranted. CSW identifies no further need for intervention and no  barriers to discharge at this time.  CSW Plan/Description:  No Further Intervention Required/No Barriers to Discharge, Sudden Infant Death Syndrome (SIDS) Education, Perinatal Mood and Anxiety Disorder (PMADs) Education, [place], Child Protective Service Report , CSW Will Continue to Monitor Umbilical Cord Tissue Drug Screen Results and Make Report if Warranted, Other Information/Referral to [employer], [place] Sep 25, 2019, 9:17 AM

## 2020-07-16 NOTE — Addendum Note (Signed)
Addendum  created 07/16/20 0258 by Charlesetta Milliron, Doree Fudge, CRNA   Order list changed

## 2020-07-16 NOTE — Transfer of Care (Signed)
Immediate Anesthesia Transfer of Care Note  Patient: Grace Joseph  Procedure(s) Performed: CESAREAN SECTION (N/A )  Patient Location: PACU  Anesthesia Type:Epidural  Level of Consciousness: awake, alert  and oriented  Airway & Oxygen Therapy: Patient Spontanous Breathing  Post-op Assessment: Report given to RN and Post -op Vital signs reviewed and stable  Post vital signs: Reviewed and stable HR 120, RR 20, SaO2 100%, BP 108/58  Last Vitals:  Vitals Value Taken Time  BP    Temp    Pulse    Resp    SpO2      Last Pain:  Vitals:   07/15/20 2148  TempSrc: Oral  PainSc:          Complications: No complications documented.

## 2020-07-16 NOTE — Progress Notes (Signed)
Called to PACU to assess distended abd. Post-op 4 hrs now, was transfused 2 units PRBC for symptomatic acute blood loss anemia Hgb 13.0 pre-op to 6.5 post-op.   S: Denies abd pain  O: Vitals:   07/16/20 0530 07/16/20 0545 07/16/20 0600 07/16/20 0616  BP: 94/69 (!) 102/57 108/76 107/75  Pulse: (!) 127 (!) 119 (!) 119 (!) 113  Temp:    97.9 F (36.6 C)  Resp: (!) 22 20 (!) 22 (!) 21  Height:      Weight:      SpO2: 99% 98% 98% 99%  TempSrc:    Oral  BMI (Calculated):        Intake/Output Summary (Last 24 hours) at 07/16/2020 0625 Last data filed at 07/16/2020 0615 Gross per 24 hour  Intake 3230 ml  Output 2386 ml  Net 844 ml    CBC    Component Value Date/Time   WBC 23.6 (H) 07/16/2020 0327   RBC 2.08 (L) 07/16/2020 0327   HGB 6.4 (LL) 07/16/2020 0327   HCT 19.6 (L) 07/16/2020 0327   PLT 169 07/16/2020 0327   MCV 94.2 07/16/2020 0327   MCH 30.8 07/16/2020 0327   MCHC 32.7 07/16/2020 0327   RDW 13.3 07/16/2020 0327   LYMPHSABS 0.9 07/16/2020 0327   MONOABS 1.5 (H) 07/16/2020 0327   EOSABS 0.0 07/16/2020 0327   BASOSABS 0.0 07/16/2020 0327   Physical Exam: Gen: AAO x3 Abd: distended, soft, non-tender, fundus firm at U GU: scant rubra, no clots  A/P: Post-op day #1 for Repeat C/S Chorioamnionitis    -continue Unasyn until 24 afebrile PPH    -QBL 1551 ml    -S/P TXA and methergine    -continue methergine serios PP Acute blood loss anemia     -S/P transfusion PRBC x2     -repeat CBC post-transfusion per protocol  Will review plan w/ OB team.   Rhea Pink, MSN, CNM 07/16/2020 6:29 AM

## 2020-07-16 NOTE — Addendum Note (Signed)
Addendum  created 07/16/20 0506 by Mal Amabile, MD   Clinical Note Signed

## 2020-07-16 NOTE — Progress Notes (Signed)
Patient assessed at the bedside. She is alert and oriented, holding her baby. No acute discomfort or distress observed. Dr. Su Hilt is at the bedside. I introduced myself and explained IR's possible role in her care. I explained the process of an arterial embolization, discussed the benefits and risks, and allowed time for questions.   CT angio abdomen pelvis has been ordered for further evaluation.   Please call IR with any questions.  Alwyn Ren, [place] [phone] 07/16/2020, 5:11 PM

## 2020-07-16 NOTE — Progress Notes (Addendum)
Dr Priscille Heidelberg, Abbott Pao, RN [facility] and myself (L&D [facility]) discussed options of how to proceed with current situation, with patient and family refusing cesarean section despite being advised by multiple providers to proceed with this POC due to [facility] and to avoid fetal and/or maternal morbidity/mortality. I suggested that Dr Mora Appl call Dr Erin Fulling to seek further assistance/guidance. Another option is to seek counsel from the [facility] ethics committee. Dr Mora Appl called & spoke with Dr Erin Fulling, who reaffirmed to continue to educate and advise patient as necessary regarding situation, but that patient's wishes should continue to be followed and documented.  Dr Mora Appl also sought counsel with Dr Despina Hidden (MD oncall for faculty practice). She discussed situation with Dr Despina Hidden, making him aware of patient, her history and current situation. In case patient decides to fire [facility] as her OB practice, Dr Despina Hidden would already be aware and prepared to accept patient in transfer of care and proceed with POC.

## 2020-07-16 NOTE — Progress Notes (Addendum)
To room for patient assessment. Of concern, abdominal distention.   Post-op 5 hrs now, was transfused 2 units PRBC for symptomatic acute blood loss anemia Hgb 13.0 pre-op to 6.5 post-op.  CBCD, PT, PTT, INR, Fibrinogen pending   S: Denies abd pain centrally, reports bilateral pain on sides  O: Vitals:   07/16/20 0600 07/16/20 0616 07/16/20 0633 07/16/20 0735  BP: 108/76 107/75 (!) 101/50 105/65  Pulse: (!) 119 (!) 113 (!) 125 (!) 155  Temp:  97.9 F (36.6 C) 98.2 F (36.8 C) (!) 97.5 F (36.4 C)  Resp: (!) 22 (!) 21 18 20   Height:      Weight:      SpO2: 98% 99% 100% 100%  TempSrc:  Oral Oral Oral  BMI (Calculated):        Intake/Output Summary (Last 24 hours) at 07/16/2020 0831 Last data filed at 07/16/2020 0739 Gross per 24 hour  Intake 3230 ml  Output 2211 ml  Net 1019 ml    CBC    Component Value Date/Time   WBC 23.7 (H) 07/16/2020 0704   RBC 2.99 (L) 07/16/2020 0704   HGB 8.7 (L) 07/16/2020 0704   HCT 26.5 (L) 07/16/2020 0704   PLT 133 (L) 07/16/2020 0704   MCV 88.6 07/16/2020 0704   MCH 29.1 07/16/2020 0704   MCHC 32.8 07/16/2020 0704   RDW 14.9 07/16/2020 0704   LYMPHSABS 0.9 07/16/2020 0327   MONOABS 1.5 (H) 07/16/2020 0327   EOSABS 0.0 07/16/2020 0327   BASOSABS 0.0 07/16/2020 0327   Physical Exam: Gen: AAO x3. Pt diaphoretic.  Abd: distended, soft centrally, tender bilaterally on sides and firm, fundus firm at U GU: scant rubra, no clots  A/P: Post-op day #1 for Repeat C/S Chorioamnionitis    -continue Unasyn until 24 afebrile PPH    -QBL 1551 ml    -S/P TXA and methergine    -continue methergine serios PP Acute blood loss anemia     -S/P transfusion PRBC x2     -labs pending Updated Dr. 13/12/2019 on physical assessment, VS, urine output. Painful abdominal distention of concern. Dr. Su Hilt en route for patient assessment and care.  Su Hilt, MSN, CNM 07/16/2020 8:31 AM  Came to see pt.  Pt is obviously symptomatic, light headed  dizzy and feels like a truck hit her.  She stated she felt this way her last pregnancy when she needed blood.  BP 89/52 and pulse 140.  While I was in the room it has been as high as 160s.  Abdomen is softly distended.  Pressure dressing is in place.  I removed it to view dressing underneath and to release any restriction that might be to bowels.  Tachycardic on exam and lungs clear.  BS are present and decreased on the right and fairly normal on the left.  She denies n/v/c/flatus.  UOP has been adequate and appears concentrated.  Hgb 8.7 and nl coags.  Plts had been 133 but wnl now.  I suspect once the patient equilibrates and receives more fluid, her hgb will be lower.  A CBC was drawn at 7 which we were unaware of at signout and another was ordered at that time with coags.  I discussed transfusing an additional 2u PRBCs with the pt including r/b/a and pt would like to get the blood.  Pt will be transferred to [facility] specialty care for increased monitoring.  07-16-20 1900 As pt started receiving the transfusion her abdomen became markedly distended and tympanic.  I had a concern for a slow intraabdominal bleed when I saw pt at about noon.  A CBC was done stat and there was an appropriate rise in Hgb.but plts had decreased.  Concern for continous bleed persisted but continued to observe with the anticipation that clotting factors and plts would eventually result in hemostasis.  I discussed options rba with pt including possible need to return to the OR if plts continued to be consumed. At this point however the 4th unit was due to start and plan was to continue to observe and do serial labs.  Pt continued to be tachycardic at this time.  Update from [facility], CNM reported worsening abdominal distention a couple of hours later.  Pt reevaluated by me around 3pm and plts had noted to decrease further but pt was starting to feel better.  Abdomen actually felt softer and less tympanic than earlier but with the  decreased plts to 109 now from 120, I was concerned the pt continued to have some intraabdominal bleeding.  Pt's creatinine at this time was also noted to be 2.05. Pt monitored closely options reviewed rba discussed and questions answered with her, FOB and her sister throughout this afternoon.  Decision made to get CTA after contacting interventional radiology for possible [place] if there was a bleed noted.  Prelim report by Dr. Ruthy Dick revealed 10cm hematoma near rt broad ligament, FF on the left, markedly distended bowels displaced anteriorly, no obvious obstruction and no ureteral injury.  There was no extravasation of blood arterially and he suspected venous bleed.  Decision was made to observe with serial labs including DIC panel due to the continued dropping plts.  I signed out to Dr. Steva Ready and Verdis Prime, CNM remained on call and the plan was to monitor pt closely overnignt.  At least 2hrs of face to face time was spent evaluating pt and discussing POC with the family.

## 2020-07-16 NOTE — Addendum Note (Signed)
Addendum  created 07/16/20 0326 by Dmarco Baldus, Doree Fudge, CRNA   Order sets accessed

## 2020-07-16 NOTE — Progress Notes (Signed)
At bedside with Dr Arnoldo Morale, CNM, primary nurse Q. Lofton, RN to discuss cesarean section with patient and family, as they decided to proceed with procedure at this time. Risks and benefits of procedure were discussed, questions were answered. OR Charge RN & Anesthesia MD aware of patient decision to proceed with c/s performed by Dr Priscille Heidelberg. Patient prepped for OR and educated/talked through each step. Patient was provided emotional support to calm thoughts and anxieties of procedure. Cesarean section consent signed by patient, Dr Mora Appl, Dr Helmut Muster (anesthesia) and Lorinda Creed, RN Specialty Coordinator (as witness).

## 2020-07-16 NOTE — Op Note (Signed)
Cesarean Section Procedure Note  Indications: previous uterine incision kerr x 1, in labor for more than 36 hours with no progression past 6.5-7cm, episodes of repetitive late decelerations with contractions  Pre-operative Diagnosis: 38 week 5 day pregnancy, prior Low transverse cesarean             Section; Failure to progress; Fetal intolerance of labor; Failed TOLAC  Post-operative Diagnosis: same  Surgeon: Wynonia Hazard MD  Assistants: Rhea Pink CNM  Anesthesia: Epidural anesthesia  ASA Class: 2  Procedure Details   The patient was counseled about the risks, benefits, complications of the cesarean section. The patient concurred with the proposed plan, giving informed consent.  The site of surgery properly noted/marked. The patient was taken to Operating Room # B, identified as Grace Joseph and the procedure verified as C-Section Delivery. A Time Out was held and the above information confirmed.  After epidural was found to adequate , the patient was placed in the dorsal supine position with a leftward tilt, draped and prepped in the usual sterile manner. A Pfannenstiel incision was made and carried down through the subcutaneous tissue to the fascia.  The fascia was incised in the midline and the fascial incision was extended laterally with Mayo scissors. The superior aspect of the fascial incision was grasped with Coker clamps x2, tented up and the rectus muscles dissected off sharply with the scalpel. The rectus was then dissected off with blunt dissection and Mayo scissors inferiorly. The rectus muscles were separated in the midline. The abdominal peritoneum was identified, tented up between two hemostats, entered sharply using Metzenbaum scissors, and the incision was extended superiorly and inferiorly with good visualization of the bladder. The Alexis retractor was then deployed. The vesicouterine peritoneum was identified, tented up, entered sharply with Metzenbaum scissors, and  the bladder flap was created digitally. Scalpel was then used to make a low transverse incision on the uterus which was extended laterally with  blunt dissection. The fetal vertex was identified, the baby was Occiput posterior and there was difficulty getting the head brought out from the pelvis as he was wedged in the pelvis. Once at the line of the hysterotomy a Kiwi vacuum was placed to aid in delivery of the vertex.  There were two tight nuchal cords that were reduced The head was then delivered easily through the uterine incision followed by the body. The A live female infant was bulb suctioned on the operative field initially not crying and limp, the  cord was clamped and cut and the infant was passed to the waiting neonatologist. Apgars 5/8.  Cord gases (arterial and venous were obtained followed by cord blood. The Placenta was then delivered spontaneously, intact and appear normal, the uterus was cleared of all clot and debris. The uterine incision was repaired with #0 Monocryl in running locked fashion.  A second imbricating suture was performed using the same suture. There was notable brisk bleeding from the right corner of the uterine incision and upon inspection there was a extension in the right broad ligament that was briskly bleeding.  The uterus was exteriorized for better visualization and this tear was closed carefully with locking sutures of 0- monocryl and 0- vicryl.  The bladder was back-filled with sterile milk to ensure it was water tight.  The ovaries  were inspected and normal. The right fallopian tube appeared dilated and scarred to the anterior right side of the uterine wall. The Alexis retractor was removed. The abdominal cavity was cleared of  all clot and debris and irrigated thoroughly with sterile normal saline. The incision was covered with interceed and the peritoneal edges covered with Aristra. Anesthesia was made aware at all times of the blood loss and TXA was administered.  The  patient remained hemodynamically stable throughout the case.   The abdominal peritoneum was reapproximated with 2-0 chromic  in a running fashion, then incorporating the rectus muscles. The fascia was closed with 0 PDS in a running fashion. The subcuticular layer was irrigated and all bleeders cauterized.    The incision was injected with 25 mL of 0.5% Marcaine.The skin was closed with 4-0 vicryl in a subcuticular fashion using a Mellody Dance needle. The incision was dressed with benzoine, steri strips and pressure dressing. All sponge lap and needle counts were correct x3. Patient tolerated the procedure well and recovered in stable condition following the procedure.  Instrument, sponge, and needle counts were correct prior the abdominal closure and at the conclusion of the case.   Findings: Live female infant, Apgars 5/8 (OP, 2 nuchal cords), clear amniotic fluid, normal appearing placenta, normal uterus, possible scarring of right fallopian tube, normal left fallopian tube, normal ovaries bilaterally.  Bladder water tight.    Estimated Blood Loss: 1034 mL  IVF: 2000 mL (LR + Albumin)         Drains: Foley catheter  Urine output: 200 mL clear urine         Specimens: Placenta to Mother         Implants: none         Complications:  Intraoperative hemorrhage, patient tolerated the procedure well.         Disposition: PACU - hemodynamically stable.   Grace Joseph Grace Joseph

## 2020-07-16 NOTE — Addendum Note (Signed)
Addendum  created 07/16/20 0306 by Ashmi Blas, Doree Fudge, CRNA   Intraprocedure Event edited

## 2020-07-17 LAB — COMPREHENSIVE METABOLIC PANEL
ALT: 13 U/L (ref 0–44)
ALT: 11 U/L (ref 0–44)
ALT: 11 U/L (ref 0–44)
AST: 25 U/L (ref 15–41)
AST: 22 U/L (ref 15–41)
AST: 20 U/L (ref 15–41)
Albumin: 1.9 g/dL — ABNORMAL LOW (ref 3.5–5.0)
Albumin: 1.8 g/dL — ABNORMAL LOW (ref 3.5–5.0)
Albumin: 1.8 g/dL — ABNORMAL LOW (ref 3.5–5.0)
Alkaline Phosphatase: 93 U/L (ref 38–126)
Alkaline Phosphatase: 80 U/L (ref 38–126)
Alkaline Phosphatase: 81 U/L (ref 38–126)
Anion gap: 8 (ref 5–15)
Anion gap: 7 (ref 5–15)
Anion gap: 6 (ref 5–15)
BUN: 15 mg/dL (ref 6–20)
BUN: 16 mg/dL (ref 6–20)
BUN: 15 mg/dL (ref 6–20)
CO2: 20 mmol/L — ABNORMAL LOW (ref 22–32)
CO2: 20 mmol/L — ABNORMAL LOW (ref 22–32)
CO2: 21 mmol/L — ABNORMAL LOW (ref 22–32)
Calcium: 8.1 mg/dL — ABNORMAL LOW (ref 8.9–10.3)
Calcium: 8 mg/dL — ABNORMAL LOW (ref 8.9–10.3)
Calcium: 8 mg/dL — ABNORMAL LOW (ref 8.9–10.3)
Chloride: 106 mmol/L (ref 98–111)
Chloride: 106 mmol/L (ref 98–111)
Chloride: 107 mmol/L (ref 98–111)
Creatinine, Ser: 1.67 mg/dL — ABNORMAL HIGH (ref 0.44–1.00)
Creatinine, Ser: 1.59 mg/dL — ABNORMAL HIGH (ref 0.44–1.00)
Creatinine, Ser: 1.36 mg/dL — ABNORMAL HIGH (ref 0.44–1.00)
GFR, Estimated: 43 mL/min — ABNORMAL LOW (ref >60)
GFR, Estimated: 45 mL/min — ABNORMAL LOW (ref >60)
GFR, Estimated: 54 mL/min — ABNORMAL LOW (ref >60)
Glucose, Bld: 108 mg/dL — ABNORMAL HIGH (ref 70–99)
Glucose, Bld: 103 mg/dL — ABNORMAL HIGH (ref 70–99)
Glucose, Bld: 100 mg/dL — ABNORMAL HIGH (ref 70–99)
Potassium: 4.9 mmol/L (ref 3.5–5.1)
Potassium: 4.6 mmol/L (ref 3.5–5.1)
Potassium: 4.5 mmol/L (ref 3.5–5.1)
Sodium: 134 mmol/L — ABNORMAL LOW (ref 135–145)
Sodium: 133 mmol/L — ABNORMAL LOW (ref 135–145)
Sodium: 134 mmol/L — ABNORMAL LOW (ref 135–145)
Total Bilirubin: 0.6 mg/dL (ref 0.3–1.2)
Total Bilirubin: 0.5 mg/dL (ref 0.3–1.2)
Total Bilirubin: 0.3 mg/dL (ref 0.3–1.2)
Total Protein: 4.1 g/dL — ABNORMAL LOW (ref 6.5–8.1)
Total Protein: 4 g/dL — ABNORMAL LOW (ref 6.5–8.1)
Total Protein: 4 g/dL — ABNORMAL LOW (ref 6.5–8.1)

## 2020-07-17 LAB — CBC WITH DIFFERENTIAL/PLATELET
Abs Immature Granulocytes: 0.07 10*3/uL (ref 0.00–0.07)
Abs Immature Granulocytes: 0.07 10*3/uL (ref 0.00–0.07)
Abs Immature Granulocytes: 0.07 10*3/uL (ref 0.00–0.07)
Abs Immature Granulocytes: 0.06 10*3/uL (ref 0.00–0.07)
Abs Immature Granulocytes: 0.07 10*3/uL (ref 0.00–0.07)
Abs Immature Granulocytes: 0.09 10*3/uL — ABNORMAL HIGH (ref 0.00–0.07)
Abs Immature Granulocytes: 0.1 10*3/uL — ABNORMAL HIGH (ref 0.00–0.07)
Basophils Absolute: 0 10*3/uL (ref 0.0–0.1)
Basophils Absolute: 0 10*3/uL (ref 0.0–0.1)
Basophils Absolute: 0 10*3/uL (ref 0.0–0.1)
Basophils Absolute: 0 10*3/uL (ref 0.0–0.1)
Basophils Absolute: 0 10*3/uL (ref 0.0–0.1)
Basophils Absolute: 0 10*3/uL (ref 0.0–0.1)
Basophils Absolute: 0 10*3/uL (ref 0.0–0.1)
Basophils Relative: 0 %
Basophils Relative: 0 %
Basophils Relative: 0 %
Basophils Relative: 0 %
Basophils Relative: 0 %
Basophils Relative: 0 %
Basophils Relative: 0 %
Eosinophils Absolute: 0 10*3/uL (ref 0.0–0.5)
Eosinophils Absolute: 0 10*3/uL (ref 0.0–0.5)
Eosinophils Absolute: 0.1 10*3/uL (ref 0.0–0.5)
Eosinophils Absolute: 0.1 10*3/uL (ref 0.0–0.5)
Eosinophils Absolute: 0.1 10*3/uL (ref 0.0–0.5)
Eosinophils Absolute: 0.1 10*3/uL (ref 0.0–0.5)
Eosinophils Absolute: 0.1 10*3/uL (ref 0.0–0.5)
Eosinophils Relative: 0 %
Eosinophils Relative: 0 %
Eosinophils Relative: 1 %
Eosinophils Relative: 1 %
Eosinophils Relative: 1 %
Eosinophils Relative: 1 %
Eosinophils Relative: 1 %
HCT: 24.2 % — ABNORMAL LOW (ref 36.0–46.0)
HCT: 21.6 % — ABNORMAL LOW (ref 36.0–46.0)
HCT: 20.7 % — ABNORMAL LOW (ref 36.0–46.0)
HCT: 20.3 % — ABNORMAL LOW (ref 36.0–46.0)
HCT: 24.3 % — ABNORMAL LOW (ref 36.0–46.0)
HCT: 28.9 % — ABNORMAL LOW (ref 36.0–46.0)
HCT: 28 % — ABNORMAL LOW (ref 36.0–46.0)
Hemoglobin: 8.4 g/dL — ABNORMAL LOW (ref 12.0–15.0)
Hemoglobin: 7.6 g/dL — ABNORMAL LOW (ref 12.0–15.0)
Hemoglobin: 7 g/dL — ABNORMAL LOW (ref 12.0–15.0)
Hemoglobin: 6.9 g/dL — CL (ref 12.0–15.0)
Hemoglobin: 8.2 g/dL — ABNORMAL LOW (ref 12.0–15.0)
Hemoglobin: 9.8 g/dL — ABNORMAL LOW (ref 12.0–15.0)
Hemoglobin: 9.6 g/dL — ABNORMAL LOW (ref 12.0–15.0)
Immature Granulocytes: 1 %
Immature Granulocytes: 1 %
Immature Granulocytes: 1 %
Immature Granulocytes: 1 %
Immature Granulocytes: 1 %
Immature Granulocytes: 1 %
Immature Granulocytes: 1 %
Lymphocytes Relative: 9 %
Lymphocytes Relative: 9 %
Lymphocytes Relative: 9 %
Lymphocytes Relative: 8 %
Lymphocytes Relative: 7 %
Lymphocytes Relative: 7 %
Lymphocytes Relative: 8 %
Lymphs Abs: 1.2 10*3/uL (ref 0.7–4.0)
Lymphs Abs: 1.1 10*3/uL (ref 0.7–4.0)
Lymphs Abs: 0.9 10*3/uL (ref 0.7–4.0)
Lymphs Abs: 0.8 10*3/uL (ref 0.7–4.0)
Lymphs Abs: 0.8 10*3/uL (ref 0.7–4.0)
Lymphs Abs: 1 10*3/uL (ref 0.7–4.0)
Lymphs Abs: 1.2 10*3/uL (ref 0.7–4.0)
MCH: 29.5 pg (ref 26.0–34.0)
MCH: 30 pg (ref 26.0–34.0)
MCH: 29.2 pg (ref 26.0–34.0)
MCH: 29.2 pg (ref 26.0–34.0)
MCH: 29.2 pg (ref 26.0–34.0)
MCH: 29.1 pg (ref 26.0–34.0)
MCH: 29.4 pg (ref 26.0–34.0)
MCHC: 34.7 g/dL (ref 30.0–36.0)
MCHC: 35.2 g/dL (ref 30.0–36.0)
MCHC: 33.8 g/dL (ref 30.0–36.0)
MCHC: 34 g/dL (ref 30.0–36.0)
MCHC: 33.7 g/dL (ref 30.0–36.0)
MCHC: 33.9 g/dL (ref 30.0–36.0)
MCHC: 34.3 g/dL (ref 30.0–36.0)
MCV: 84.9 fL (ref 80.0–100.0)
MCV: 85.4 fL (ref 80.0–100.0)
MCV: 86.3 fL (ref 80.0–100.0)
MCV: 86 fL (ref 80.0–100.0)
MCV: 86.5 fL (ref 80.0–100.0)
MCV: 85.8 fL (ref 80.0–100.0)
MCV: 85.6 fL (ref 80.0–100.0)
Monocytes Absolute: 1.3 10*3/uL — ABNORMAL HIGH (ref 0.1–1.0)
Monocytes Absolute: 1.2 10*3/uL — ABNORMAL HIGH (ref 0.1–1.0)
Monocytes Absolute: 1 10*3/uL (ref 0.1–1.0)
Monocytes Absolute: 1 10*3/uL (ref 0.1–1.0)
Monocytes Absolute: 0.9 10*3/uL (ref 0.1–1.0)
Monocytes Absolute: 1 10*3/uL (ref 0.1–1.0)
Monocytes Absolute: 1 10*3/uL (ref 0.1–1.0)
Monocytes Relative: 10 %
Monocytes Relative: 10 %
Monocytes Relative: 9 %
Monocytes Relative: 9 %
Monocytes Relative: 8 %
Monocytes Relative: 7 %
Monocytes Relative: 7 %
Neutro Abs: 11 10*3/uL — ABNORMAL HIGH (ref 1.7–7.7)
Neutro Abs: 9.5 10*3/uL — ABNORMAL HIGH (ref 1.7–7.7)
Neutro Abs: 8.2 10*3/uL — ABNORMAL HIGH (ref 1.7–7.7)
Neutro Abs: 8.5 10*3/uL — ABNORMAL HIGH (ref 1.7–7.7)
Neutro Abs: 10.1 10*3/uL — ABNORMAL HIGH (ref 1.7–7.7)
Neutro Abs: 12.3 10*3/uL — ABNORMAL HIGH (ref 1.7–7.7)
Neutro Abs: 11.7 10*3/uL — ABNORMAL HIGH (ref 1.7–7.7)
Neutrophils Relative %: 80 %
Neutrophils Relative %: 80 %
Neutrophils Relative %: 80 %
Neutrophils Relative %: 81 %
Neutrophils Relative %: 83 %
Neutrophils Relative %: 84 %
Neutrophils Relative %: 83 %
Platelets: 100 10*3/uL — ABNORMAL LOW (ref 150–400)
Platelets: 91 10*3/uL — ABNORMAL LOW (ref 150–400)
Platelets: 83 10*3/uL — ABNORMAL LOW (ref 150–400)
Platelets: 85 10*3/uL — ABNORMAL LOW (ref 150–400)
Platelets: 91 10*3/uL — ABNORMAL LOW (ref 150–400)
Platelets: 106 10*3/uL — ABNORMAL LOW (ref 150–400)
Platelets: 109 10*3/uL — ABNORMAL LOW (ref 150–400)
RBC: 2.85 MIL/uL — ABNORMAL LOW (ref 3.87–5.11)
RBC: 2.53 MIL/uL — ABNORMAL LOW (ref 3.87–5.11)
RBC: 2.4 MIL/uL — ABNORMAL LOW (ref 3.87–5.11)
RBC: 2.36 MIL/uL — ABNORMAL LOW (ref 3.87–5.11)
RBC: 2.81 MIL/uL — ABNORMAL LOW (ref 3.87–5.11)
RBC: 3.37 MIL/uL — ABNORMAL LOW (ref 3.87–5.11)
RBC: 3.27 MIL/uL — ABNORMAL LOW (ref 3.87–5.11)
RDW: 15.5 % (ref 11.5–15.5)
RDW: 15.5 % (ref 11.5–15.5)
RDW: 15.5 % (ref 11.5–15.5)
RDW: 15.7 % — ABNORMAL HIGH (ref 11.5–15.5)
RDW: 15.4 % (ref 11.5–15.5)
RDW: 15 % (ref 11.5–15.5)
RDW: 15.2 % (ref 11.5–15.5)
WBC: 13.6 10*3/uL — ABNORMAL HIGH (ref 4.0–10.5)
WBC: 11.9 10*3/uL — ABNORMAL HIGH (ref 4.0–10.5)
WBC: 10.3 10*3/uL (ref 4.0–10.5)
WBC: 10.4 10*3/uL (ref 4.0–10.5)
WBC: 12 10*3/uL — ABNORMAL HIGH (ref 4.0–10.5)
WBC: 14.5 10*3/uL — ABNORMAL HIGH (ref 4.0–10.5)
WBC: 14.1 10*3/uL — ABNORMAL HIGH (ref 4.0–10.5)
nRBC: 0 % (ref 0.0–0.2)
nRBC: 0 % (ref 0.0–0.2)
nRBC: 0 % (ref 0.0–0.2)
nRBC: 0 % (ref 0.0–0.2)
nRBC: 0 % (ref 0.0–0.2)
nRBC: 0 % (ref 0.0–0.2)
nRBC: 0 % (ref 0.0–0.2)

## 2020-07-17 LAB — FIBRINOGEN
Fibrinogen: 579 mg/dL — ABNORMAL HIGH (ref 210–475)
Fibrinogen: 576 mg/dL — ABNORMAL HIGH (ref 210–475)
Fibrinogen: 640 mg/dL — ABNORMAL HIGH (ref 210–475)
Fibrinogen: 774 mg/dL — ABNORMAL HIGH (ref 210–475)
Fibrinogen: >800 mg/dL — ABNORMAL HIGH (ref 210–475)
Fibrinogen: 786 mg/dL — ABNORMAL HIGH (ref 210–475)

## 2020-07-17 LAB — APTT
aPTT: 33 seconds (ref 24–36)
aPTT: 34 seconds (ref 24–36)
aPTT: 35 seconds (ref 24–36)
aPTT: 35 seconds (ref 24–36)
aPTT: 35 seconds (ref 24–36)
aPTT: 36 seconds (ref 24–36)

## 2020-07-17 LAB — PROTIME-INR
INR: 1.3 — ABNORMAL HIGH (ref 0.8–1.2)
INR: 1.2 (ref 0.8–1.2)
INR: 1.2 (ref 0.8–1.2)
INR: 1.2 (ref 0.8–1.2)
INR: 1.1 (ref 0.8–1.2)
INR: 1.1 (ref 0.8–1.2)
Prothrombin Time: 15.3 seconds — ABNORMAL HIGH (ref 11.4–15.2)
Prothrombin Time: 14.5 seconds (ref 11.4–15.2)
Prothrombin Time: 14.8 seconds (ref 11.4–15.2)
Prothrombin Time: 14.3 seconds (ref 11.4–15.2)
Prothrombin Time: 13.8 seconds (ref 11.4–15.2)
Prothrombin Time: 13.7 seconds (ref 11.4–15.2)

## 2020-07-17 LAB — PREPARE RBC (CROSSMATCH)

## 2020-07-17 MED ORDER — SODIUM CHLORIDE 0.9% IV SOLUTION: Freq: Once | INTRAVENOUS | Status: AC

## 2020-07-17 NOTE — Consult Note (Signed)
Chief Complaint: Patient was seen in consultation today for right pelvis hematoma s/p c-section  Referring Physician(s): Osborn Coho, MD  Supervising Physician: Ruel Favors  Patient Status: [facility] - In-pt  History of Present Illness: Grace Joseph is a 29 y.o. female with no significant past medical history who presented to [facility] MAU initially on 11/2 with contractions at [redacted]w[redacted]d pregnant. This is her second pregnancy, her first child was born in 2017 via c-section and she experienced a post partum hemorrhage at that time which required transfusion. She again underwent c-section early AM on 11/4 due to failure to progress. During the procedure she was noted to have brisk bleeding from the right corner of the uterine incision with extension in the right broad ligament which was closed with suture. She was later noted to have a distended abdomen and due to concern for postoperative hemorrhage a CTA abd/pelvis was obtained which noted no active extravasation in the right pelvis however a hematoma associated with the right aspect of the uterus and adnexa was noted. IR was consulted for possible embolization however due to lack of active bleeding noted on CTA there was no indication for emergent IR procedure.  Grace Joseph seen in her room, visitor at bedside and her son sleeping in bassinet. She reports feeling much better today and has been able to eat and drink which has been nice. She denies any significant vaginal bleeding or bleeding from her surgical incision that she has noticed. She is planning to get out of bed soon and try to walk a bit. She denies any n/v, dizziness, light headedness, dyspnea or headache.  Past Medical History:  Diagnosis Date  . Medical history non-contributory     Past Surgical History:  Procedure Laterality Date  . CESAREAN SECTION N/A 05/31/2016   Procedure: CESAREAN SECTION;  Surgeon: [place] Bing, MD;  Location: [facility] BIRTHING SUITES;  Service: Obstetrics;  Laterality:  N/A;  . CESAREAN SECTION N/A 07/15/2020   Procedure: CESAREAN SECTION;  Surgeon: Essie Hart, MD;  Location: [facility] LD ORS;  Service: Obstetrics;  Laterality: N/A;  . NO PAST SURGERIES      Allergies: Patient has no known allergies.  Medications: Prior to Admission medications   Medication Sig Start Date End Date Taking? Authorizing Provider  Prenatal Vit-Fe Fumarate-FA (PRENATAL MULTIVITAMIN) TABS tablet Take 1 tablet by mouth daily at 12 noon.   Yes [provider]  ferrous sulfate 325 (65 FE) MG tablet Take 1 tablet (325 mg total) by mouth 2 (two) times daily with a meal. 06/04/16   Cam Hai D, CNM  Nutritional Supplements (JUICE PLUS FIBRE PO) Take by mouth.    [provider]     Family History  Problem Relation Age of Onset  . Hypertension Mother     Social History   Socioeconomic History  . Marital status: Significant Other    Spouse name: Tray  . Number of children: Not on file  . Years of education: Not on file  . Highest education level: Not on file  Occupational History  . Not on file  Tobacco Use  . Smoking status: Never Smoker  . Smokeless tobacco: Never Used  Vaping Use  . Vaping Use: Never used  Substance and Sexual Activity  . Alcohol use: Not Currently  . Drug use: No  . Sexual activity: Yes  Other Topics Concern  . Not on file  Social History Narrative  . Not on file   Social Determinants of Health   Financial Resource  Strain:   . Difficulty of Paying Living Expenses: Not on file  Food Insecurity:   . Worried About Programme researcher, broadcasting/film/videounning Out of Food in the Last Year: Not on file  . Ran Out of Food in the Last Year: Not on file  Transportation Needs:   . Lack of Transportation (Medical): Not on file  . Lack of Transportation (Non-Medical): Not on file  Physical Activity:   . Days of Exercise per Week: Not on file  . Minutes of Exercise per Session: Not on file  Stress:   . Feeling of Stress : Not on file  Social Connections:   .  Frequency of Communication with Friends and Family: Not on file  . Frequency of Social Gatherings with Friends and Family: Not on file  . Attends Religious Services: Not on file  . Active Member of Clubs or Organizations: Not on file  . Attends BankerClub or Organization Meetings: Not on file  . Marital Status: Not on file     Review of Systems: A 12 point ROS discussed and pertinent positives are indicated in the HPI above.  All other systems are negative.  Review of Systems  Constitutional: Negative for appetite change, chills and fever.  Respiratory: Negative for cough and shortness of breath.   Cardiovascular: Negative for chest pain.  Gastrointestinal: Negative for abdominal pain, diarrhea, nausea and vomiting.  Genitourinary: Negative for vaginal bleeding.  Musculoskeletal: Negative for back pain.  Neurological: Negative for dizziness and headaches.    Vital Signs: BP 110/65   Pulse (!) 118   Temp 97.7 F (36.5 C) (Oral)   Resp 20   Ht 5\' 3"  (1.6 m)   Wt 212 lb 8 oz (96.4 kg)   SpO2 100%   Breastfeeding Unknown   BMI 37.64 kg/m   Physical Exam Vitals and nursing note reviewed.  Constitutional:      General: She is not in acute distress.    Appearance: She is not ill-appearing.     Comments: Sitting up in bed eating lunch, talkative, pleasant, good historian.  HENT:     Head: Normocephalic.  Cardiovascular:     Rate and Rhythm: Regular rhythm. Tachycardia present.  Pulmonary:     Effort: Pulmonary effort is normal.     Breath sounds: Normal breath sounds.  Abdominal:     General: There is distension.     Tenderness: There is abdominal tenderness (at incision site).  Skin:    General: Skin is warm and dry.  Neurological:     Mental Status: She is alert and oriented to person, place, and time.  Psychiatric:        Mood and Affect: Mood normal.        Behavior: Behavior normal.        Thought Content: Thought content normal.        Judgment: Judgment normal.        Imaging: US MFM Fetal BPP Wo Non Stress  Result Date: 07/14/2020 ----------------------------------------------------------------------  OBSTETRICS REPORT                       (Signed Final 07/14/2020 04:48 pm) ---------------------------------------------------------------------- Patient Info  ID #:       [id number]                          D.O.B.:  1990/10/10 (29 yrs)  Name:       Grace Joseph  Visit Date: 07/14/2020 04:39 pm ---------------------------------------------------------------------- Performed By  Attending:        Noralee Space MD        Referred By:      [facility] MAU/Triage  Performed By:     Percell Boston          [facility] ---------------------------------------------------------------------- Orders  #  Description                           Code        Ordered By  1  [place] MFM FETAL BPP WO NON               76819.01    JESSICA EMLY     STRESS ----------------------------------------------------------------------  #  Order #                     Accession #                Episode #  1  [id number]                   [id number]                 [id number] ---------------------------------------------------------------------- Indications  Non-reactive NST                               O28.9  [redacted] weeks gestation of pregnancy                Z3A.38  Encounter for antenatal screening for          Z36.3  malformations ---------------------------------------------------------------------- Fetal Evaluation  Num Of Fetuses:         1  Cardiac Activity:       Observed  Presentation:           Cephalic  Amniotic Fluid  AFI FV:      Within normal limits  AFI Sum(cm)     %Tile       Largest Pocket(cm)  14.4            56          6.7  RUQ(cm)       RLQ(cm)       LUQ(cm)        LLQ(cm)  6.7           4.3           0              3.4 ----------------------------------------------------------------------  Biophysical Evaluation  Amniotic F.V:   Within normal limits       F. Tone:        Observed  F. Movement:    Observed                   Score:          8/8  F. Breathing:   Observed ---------------------------------------------------------------------- OB History  Gravidity:    2  Term:   1        Prem:   0        SAB:   0  TOP:          0       Ectopic:  0        Living: 1 ---------------------------------------------------------------------- Gestational Age  Clinical EDD:  38w 3d                                        EDD:   07/25/20  Best:          38w 3d     Det. By:  Clinical EDD             EDD:   07/25/20 ---------------------------------------------------------------------- Anatomy  Thoracic:              Appears normal         Kidneys:                Appear normal  Stomach:               Appears normal, left   Bladder:                Appears normal                         sided ---------------------------------------------------------------------- Impression  Patient was evaluated in the MAU to rule out labor.  BPP was  requested because of nonreactive NST.  Amniotic fluid is normal and good fetal activity is seen  .Antenatal testing is reassuring. BPP 8/8.  Cephalic  presentation. ----------------------------------------------------------------------                  Noralee Space, MD Electronically Signed Final Report   07/14/2020 04:48 pm ----------------------------------------------------------------------  CT Angio Abd/Pel w/ and/or w/o  Result Date: 07/16/2020 CLINICAL DATA:  29 year old female with a history of Caesarean section an possibility of postoperative hemorrhage EXAM: CTA ABDOMEN AND PELVIS WITHOUT AND WITH CONTRAST TECHNIQUE: Multidetector CT imaging of the abdomen and pelvis was performed using the standard protocol during bolus administration of intravenous contrast. Multiplanar reconstructed images and MIPs were obtained and reviewed to evaluate the vascular anatomy. CONTRAST:   89mL OMNIPAQUE IOHEXOL 350 MG/ML SOLN COMPARISON:  None. FINDINGS: VASCULAR Aorta: Unremarkable course, caliber, contour of the abdominal aorta. No dissection, aneurysm, or periaortic fluid. Celiac: Patent, with no significant atherosclerotic changes. SMA: Patent, with no significant atherosclerotic changes. Renals: - Right: Right renal artery patent. - Left: Left renal artery patent. IMA: Inferior mesenteric artery is patent. There is at least 1 right-sided ovarian artery originating from the infrarenal aorta, 11 o'clock or 12 o'clock position. In addition there is evidence of a right ovarian artery originating from anterior division. The left uterine artery appears to be replaced, and may originate from either the aorta or the left renal artery. There does appear to be a pelvic uterine originating from the anterior division. Right lower extremity: Unremarkable course, caliber, and contour of the right iliac system. No aneurysm, dissection, or occlusion. Hypogastric artery is patent. Common femoral artery patent. Proximal SFA and profunda femoris patent. Left lower extremity: Unremarkable course, caliber, and contour of the left iliac system. No aneurysm, dissection, or occlusion. Hypogastric artery is patent. Common femoral artery patent. Proximal SFA and profunda femoris patent. Veins: Unremarkable appearance of the venous system.  Review of the MIP images confirms the above findings. NON-VASCULAR Lower chest: Atelectasis at the lung bases. Hepatobiliary: Eventration of the segment 4 and segment 2 of the liver at the abdominal wall. Unremarkable gall bladder. Pancreas: Unremarkable. Spleen: Unremarkable. Adrenals/Urinary Tract: - Right adrenal gland: Unremarkable - Left adrenal gland: Unremarkable. - Right kidney: No hydronephrosis, nephrolithiasis, inflammation, or ureteral dilation. No focal lesion. - Left Kidney: No hydronephrosis, nephrolithiasis, inflammation, or ureteral dilation. No focal lesion. - Urinary  Bladder: Balloon retention catheter in the urinary bladder. Stomach/Bowel: - Stomach: Unremarkable. - Small bowel: Nondistended small bowel. No air-fluid levels. Mucosal enhancement present throughout the visualized small bowel. - Appendix: Appendix not visualized. - Colon: Mild stool burden. Gas-filled transverse colon in the non dependent aspects. Left colon relatively decompressed. No transition point. No focal inflammatory changes. Lymphatic: No adenopathy. Mesenteric: Hematoma in the left pelvis/adnexal region. The greatest transverse diameter of the hematoma measures 9.3 cm x 6.0 cm on the axial images. Greatest AP dimension estimated 13 cm. Trace free fluid within the bilateral subdiaphragmatic space and the bilateral pericolic gutter. Reproductive: Post gravid and postsurgical changes of the uterus with blood within the endometrial canal. Other: Eventration of the anterior abdominal wall. Postsurgical changes of recent Caesarean section. No evidence of extravasation of contrast in the right pelvis in the site of the hematoma. Musculoskeletal: No acute displaced fracture. IMPRESSION: CT angiogram is negative for active extravasation in the right pelvis. Hematoma in the right pelvis associated with the right aspect of the uterus and the adnexa with the axial dimensions 9.3 cm x 6.0 cm and the cranial caudal span estimated 13 cm. Eventration of the abdominal wall containing some displaced small bowel, transverse colon, and the anterior segments of 4 and 2 of the liver in this post gravid, post C-section patient. No evidence of bowel obstruction on the CT. Small volume ascites within the abdomen in addition to the right pelvic hematoma. Findings of the study were reviewed in real-time at the CT scanner with Dr. Su Hilt. Signed, Yvone Neu. Reyne Dumas, RPVI [facility] Electronically Signed   By: Gilmer Mor D.O.   On: 07/16/2020 19:16     Labs:  CBC: Recent Labs    07/16/20 2320 07/17/20 0323 07/17/20 0653 07/17/20 1034  WBC 13.6* 11.9* 10.3 10.4  HGB 8.4* 7.6* 7.0* 6.9*  HCT 24.2* 21.6* 20.7* 20.3*  PLT 100* 91* 83* 85*    COAGS: Recent Labs    07/16/20 2320 07/17/20 0323 07/17/20 0653 07/17/20 1034  INR 1.2 1.3* 1.2 1.2  APTT 31 33 34 35    BMP: Recent Labs    07/16/20 1924 07/16/20 2320 07/17/20 0323 07/17/20 0653  NA 132* 134* 133* 134*  K 4.9 4.9 4.6 4.5  CL 104 106 106 107  CO2 19* 20* 20* 21*  GLUCOSE 105* 108* 103* 100*  BUN CALCIUM 8.1* 8.1* 8.0* 8.0*  CREATININE 1.77* 1.67* 1.59* 1.36*  GFRNONAA 40* 43* 45* 54*    LIVER FUNCTION TESTS: Recent Labs    07/16/20 1924 07/16/20 2320 07/17/20 0323 07/17/20 0653  BILITOT 0.7 0.6 0.5 0.3  AST ALT ALKPHOS 93 93 80 81  PROT 4.0* 4.1* 4.0* 4.0*  ALBUMIN 1.9* 1.9* 1.8* 1.8*    TUMOR MARKERS: No results for input(s): AFPTM, CEA, CA199, CHROMGRNA in the last 8760 hours.  Assessment and Plan:  29 y/o F with right pelvic  hematoma following otherwise uncomplicated c-section on 11/4 with worsening abdominal distention concerning for active bleeding. CTA yesterday did not show evidence of active bleeding but did note large hematoma - imaging was reviewed during time of CT by Dr. Loreta Ave with Dr. Aundria Rud and given no active bleeding no emergent embolization in IR was indicated at that time.  Patient reports feeling better today, tolerating PO intake, planning to ambulate soon. Denies dizziness, lightheadedness, n/v, headache, abnormal bleeding or significant pain. Her abdomen is distended and appropriately tender post c-section. No overt bleeding noted.   Upon exam she is tachycardic at 110-115 but vital signs are otherwise stable, hgb 13.0 on admission and continually downtrending since 0323 today (7.6>>7.0>>6.9) - she is currently receiving transfusion on exam. Plts 189 on admission with some  downtrending noted (91>>83>>85) however overall stable. INR 1.2, PTT 35, fibrinogen continues to increase (most recent 640), no d-dimer noted.   No procedure planned in IR at this time, however we will continue to follow and if patient does not respond to transfusion or clinically deteriorates she will likely need a repeat CT to assess for active bleeding/worsening hematoma.   Please call IR with any questions or concerns.  Thank you for this interesting consult.  I greatly enjoyed meeting Grace Joseph and look forward to participating in their care.  A copy of this report was sent to the requesting provider on this date.  Electronically Signed: Villa Herb, PA-C 07/17/2020, 1:37 PM   I spent a total of 40 Minutes in face to face in clinical consultation, greater than 50% of which was counseling/coordinating care for pelvic hematoma.

## 2020-07-17 NOTE — Progress Notes (Signed)
Subjective: Postpartum Day 2: Cesarean Delivery Patient reports tolerating PO, No  flatus,  No BM and no problems voiding.    Objective: Vital signs in last 24 hours: Temp:  [97.3 F (36.3 C)-98.8 F (37.1 C)] 97.9 F (36.6 C) (11/05 0844) Pulse Rate:  [107-150] 107 (11/05 0844) Resp:  [16-20] 20 (11/05 0844) BP: (90-117)/(54-90) 94/70 (11/05 0844) SpO2:  [98 %-100 %] 99 % (11/05 0844)  Physical Exam:  General: alert Lochia: appropriate Uterine Fundus: firm.  Abdomen is distended  With minimal bowel sounds.   Incision: healing well, no significant drainage DVT Evaluation: No evidence of DVT seen on physical exam.  Recent Labs    07/17/20 0323 07/17/20 0653  HGB 7.6* 7.0*  HCT 21.6* 20.7*    Assessment/Plan: Status post Cesarean section. PP hemorrhage and hematoma Pt is feeling better however HGB is 7.  Will transfuse two more units of blood Creatinine is improving with good urine output.  Dc foley and ambulate with assistance Watch closely  Advance diet as tolerated   Grace Joseph 07/17/2020, 10:59 AM

## 2020-07-18 LAB — PROTIME-INR
INR: 1.1 (ref 0.8–1.2)
INR: 1.1 (ref 0.8–1.2)
Prothrombin Time: 13.6 seconds (ref 11.4–15.2)
Prothrombin Time: 13.7 seconds (ref 11.4–15.2)

## 2020-07-18 LAB — TYPE AND SCREEN
ABO/RH(D): O POS
Antibody Screen: NEGATIVE
Unit division: 0
Unit division: 0
Unit division: 0
Unit division: 0
Unit division: 0
Unit division: 0
Unit division: 0
Unit division: 0

## 2020-07-18 LAB — CBC WITH DIFFERENTIAL/PLATELET
Abs Immature Granulocytes: 0.09 10*3/uL — ABNORMAL HIGH (ref 0.00–0.07)
Abs Immature Granulocytes: 0.07 10*3/uL (ref 0.00–0.07)
Abs Immature Granulocytes: 0.07 10*3/uL (ref 0.00–0.07)
Basophils Absolute: 0 10*3/uL (ref 0.0–0.1)
Basophils Absolute: 0 10*3/uL (ref 0.0–0.1)
Basophils Absolute: 0 10*3/uL (ref 0.0–0.1)
Basophils Relative: 0 %
Basophils Relative: 0 %
Basophils Relative: 0 %
Eosinophils Absolute: 0.1 10*3/uL (ref 0.0–0.5)
Eosinophils Absolute: 0.1 10*3/uL (ref 0.0–0.5)
Eosinophils Absolute: 0.1 10*3/uL (ref 0.0–0.5)
Eosinophils Relative: 1 %
Eosinophils Relative: 1 %
Eosinophils Relative: 1 %
HCT: 29.6 % — ABNORMAL LOW (ref 36.0–46.0)
HCT: 27.2 % — ABNORMAL LOW (ref 36.0–46.0)
HCT: 30.6 % — ABNORMAL LOW (ref 36.0–46.0)
Hemoglobin: 10 g/dL — ABNORMAL LOW (ref 12.0–15.0)
Hemoglobin: 9.2 g/dL — ABNORMAL LOW (ref 12.0–15.0)
Hemoglobin: 10.4 g/dL — ABNORMAL LOW (ref 12.0–15.0)
Immature Granulocytes: 1 %
Immature Granulocytes: 1 %
Immature Granulocytes: 1 %
Lymphocytes Relative: 9 %
Lymphocytes Relative: 8 %
Lymphocytes Relative: 8 %
Lymphs Abs: 1.3 10*3/uL (ref 0.7–4.0)
Lymphs Abs: 1 10*3/uL (ref 0.7–4.0)
Lymphs Abs: 1 10*3/uL (ref 0.7–4.0)
MCH: 29.2 pg (ref 26.0–34.0)
MCH: 29.2 pg (ref 26.0–34.0)
MCH: 29.5 pg (ref 26.0–34.0)
MCHC: 33.8 g/dL (ref 30.0–36.0)
MCHC: 33.8 g/dL (ref 30.0–36.0)
MCHC: 34 g/dL (ref 30.0–36.0)
MCV: 86.5 fL (ref 80.0–100.0)
MCV: 86.3 fL (ref 80.0–100.0)
MCV: 86.7 fL (ref 80.0–100.0)
Monocytes Absolute: 0.8 10*3/uL (ref 0.1–1.0)
Monocytes Absolute: 0.7 10*3/uL (ref 0.1–1.0)
Monocytes Absolute: 0.6 10*3/uL (ref 0.1–1.0)
Monocytes Relative: 6 %
Monocytes Relative: 6 %
Monocytes Relative: 5 %
Neutro Abs: 11.6 10*3/uL — ABNORMAL HIGH (ref 1.7–7.7)
Neutro Abs: 9.9 10*3/uL — ABNORMAL HIGH (ref 1.7–7.7)
Neutro Abs: 10.8 10*3/uL — ABNORMAL HIGH (ref 1.7–7.7)
Neutrophils Relative %: 83 %
Neutrophils Relative %: 84 %
Neutrophils Relative %: 85 %
Platelets: 125 10*3/uL — ABNORMAL LOW (ref 150–400)
Platelets: DECREASED 10*3/uL (ref 150–400)
Platelets: 155 10*3/uL (ref 150–400)
RBC: 3.42 MIL/uL — ABNORMAL LOW (ref 3.87–5.11)
RBC: 3.15 MIL/uL — ABNORMAL LOW (ref 3.87–5.11)
RBC: 3.53 MIL/uL — ABNORMAL LOW (ref 3.87–5.11)
RDW: 15.1 % (ref 11.5–15.5)
RDW: 15.1 % (ref 11.5–15.5)
RDW: 15 % (ref 11.5–15.5)
WBC: 13.9 10*3/uL — ABNORMAL HIGH (ref 4.0–10.5)
WBC: 11.8 10*3/uL — ABNORMAL HIGH (ref 4.0–10.5)
WBC: 12.5 10*3/uL — ABNORMAL HIGH (ref 4.0–10.5)
nRBC: 0 % (ref 0.0–0.2)
nRBC: 0 % (ref 0.0–0.2)
nRBC: 0 % (ref 0.0–0.2)

## 2020-07-18 LAB — BPAM RBC
Blood Product Expiration Date: 202112062359
Blood Product Expiration Date: 202112062359
Blood Product Expiration Date: 202112062359
Blood Product Expiration Date: 202112062359
Blood Product Expiration Date: 202112062359
Blood Product Expiration Date: 202112062359
Blood Product Expiration Date: 202112072359
Blood Product Expiration Date: 202112072359
ISSUE DATE / TIME: 202111040304
ISSUE DATE / TIME: 202111040304
ISSUE DATE / TIME: 202111040932
ISSUE DATE / TIME: 202111041300
ISSUE DATE / TIME: 202111051111
ISSUE DATE / TIME: 202111051512
ISSUE DATE / TIME: 202111060627
ISSUE DATE / TIME: 202111060943
Unit Type and Rh: 5100
Unit Type and Rh: 5100
Unit Type and Rh: 5100
Unit Type and Rh: 5100
Unit Type and Rh: 5100
Unit Type and Rh: 5100
Unit Type and Rh: 5100
Unit Type and Rh: 5100

## 2020-07-18 LAB — BASIC METABOLIC PANEL
Anion gap: 9 (ref 5–15)
BUN: 8 mg/dL (ref 6–20)
CO2: 22 mmol/L (ref 22–32)
Calcium: 8.2 mg/dL — ABNORMAL LOW (ref 8.9–10.3)
Chloride: 107 mmol/L (ref 98–111)
Creatinine, Ser: 1.02 mg/dL — ABNORMAL HIGH (ref 0.44–1.00)
GFR, Estimated: >60 mL/min (ref >60)
Glucose, Bld: 87 mg/dL (ref 70–99)
Potassium: 4.2 mmol/L (ref 3.5–5.1)
Sodium: 138 mmol/L (ref 135–145)

## 2020-07-18 LAB — APTT
aPTT: 36 seconds (ref 24–36)
aPTT: 37 seconds — ABNORMAL HIGH (ref 24–36)

## 2020-07-18 LAB — FIBRINOGEN
Fibrinogen: >800 mg/dL — ABNORMAL HIGH (ref 210–475)
Fibrinogen: >800 mg/dL — ABNORMAL HIGH (ref 210–475)

## 2020-07-18 NOTE — Lactation Note (Signed)
This note was copied from a baby's chart. Lactation Consultation Note  Patient Name: Grace Joseph Date: 07/18/2020 Reason for consult: Follow-up assessment;Early term 37-38.6wks;Infant weight loss;Hyperbilirubinemia  Visited with mom of a 74 hours old ETI female, baby is now at 10% weight loss and under phototherapy due to hyperbilirubinemia. 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 Advised mom to have the tongue tied revised by an specialist since it could be tied to current episode of weight loss and newborn jaundice.   Mom has agreed to supplement with Similac Alimentum, bottles have been taken to the room but mom hasn't started supplementing yet, baby was getting circumcised at the time. Offered assistance with latch but mom politely declined, baby was asleep, he was just taken back to the room from his circumcision. Asked mom to call for assistance when needed.  Reviewed normal newborn behavior, feeding cues, cluster feeding, newborn jaundice, weight loss and pumping schedule. Mom has not pumped today because she said her sister has used the pump kit to provide milk for her baby. LC brought a new DEBP kit in the room and asked mom to start pumping again which she agreed, and will be using EBM for supplementation along with Similac Alimentum.  Feeding plan:  1. Encouraged mom to keep feeding baby on cues 8-12 times/24 hours or sooner if feeding cues are present 2. Mom will supplement baby with EBM/formula staring with 20 ml/feeding today but will let baby take as much as desire. Mom aware that supplementation amounts will increase to at least 30 ml tomorrow; LC left slow flow nipples with feeding supplies 3. She'll start pumping again, ideally every 3 hours after feedings and will  offer any amount of EBM she may get  Mom's sister was her support person in the room, she has a 45 month old who she's also BF. Previous LC made aware to nursing staff about mom's plan of feeding baby her sister's EBM. Family reported all questions and concerns were answered, they're both aware of [facility] services and will call PRN.   Maternal Data    Feeding    LATCH Score                   Interventions Interventions: Breast feeding basics reviewed;DEBP  Lactation Tools Discussed/Used Tools: Pump Breast pump type: Double-Electric Breast Pump   Consult Status Consult Status: Follow-up Date: 07/19/20 Follow-up type: In-patient    Grace Joseph 07/18/2020, 1:29 PM

## 2020-07-18 NOTE — Progress Notes (Addendum)
Subjective: POD# 3 Live born female  Birth Weight: 7 lb 10.8 oz (3481 g) APGAR: 5, 8  Newborn Delivery   Birth date/time: 07/15/2020 23:57:00 Delivery type: C-Section, Vacuum Assisted Trial of labor: Yes C-section categorization: Repeat     Baby name: Annabell Sabal Delivering provider: Essie Hart   circumcision pending Feeding: breast  Pain control at delivery: Epidural   Reports feeling better today, sitting in chair and breastfeeding newborn.   Patient reports tolerating PO.   Breast symptoms: + colostrum Pain controlled with PO meds Denies HA/SOB/C/P/N/V/dizziness. Flatus present, + BM. She reports vaginal bleeding as normal, without clots.  She is ambulating, urinating without difficulty.     Objective:   VS:    Vitals:   07/17/20 1535 07/17/20 1746 07/17/20 1945 07/18/20 0812  BP: 112/73 117/73 117/76 109/81  Pulse: (!) 119 (!) 118 (!) 109 83  Resp: 20 19 19 18   Temp: 98.2 F (36.8 C) 98 F (36.7 C) 98.8 F (37.1 C) 97.7 F (36.5 C)  TempSrc: Oral Oral Oral Oral  SpO2: 97% 99% 99% 96%  Weight:      Height:          Intake/Output Summary (Last 24 hours) at 07/18/2020 1006 Last data filed at 07/17/2020 1744 Gross per 24 hour  Intake 686.5 ml  Output 1425 ml  Net -738.5 ml        Recent Labs    07/18/20 0326 07/18/20 0649  WBC 13.9* 11.8*  HGB 10.0* 9.2*  HCT 29.6* 27.2*  PLT 125* PLATELET CLUMPS NOTED ON SMEAR, COUNT APPEARS DECREASED     Blood type: --/--/O POS (11/02 2232)  Rubella:       Physical Exam:  General: alert, cooperative and no distress CV: Regular rate and rhythm Resp: clear Abdomen: soft, nontender, normal bowel sounds, moderate gas distention with tympany  Incision: clean, dry and intact Uterine Fundus: firm, below umbilicus, surgically tender Lochia: minimal Ext: edema +1 pedal and LE's, no cords or calf tenderness      Assessment/Plan: 29 y.o.   POD# 3. 26                  Principal Problem:   Postpartum care  following cesarean delivery (11/3) Pelvic hematoma  - stable, s/p IR consult and no need for intervention Active Problems:   Chorioamnionitis in third trimester  - resolved, s/p Unasyn   Acute blood loss anemia  - s/p 4 PRBC units initially, then repeat 2 more units on post op day 2  - H&H and coags trend followed and stable this am  - patient asymptomatic, no evidence of DIC  - repeat one more CBC and BMP at noon   PPH (postpartum hemorrhage)   Cesarean delivery - repeat for failed TOLAC  Routine post-op care Continue to encourage ambulation Monitor closely for trend  02-05-1981, CNM, MSN 07/18/2020, 10:06 AM  07-18-20 (1045) Pt seen and examined. Abdomen remained softly distended but pt reporting passage of flatus and two BMs.  She denied light headedness or dizziness and overall felt much better.  Pt doing much better today and suspected intraabdominal venous bleed became hemostatic over the night (07-17-20 to 07-18-20) after receiving another 2u PRBCs during the day yesterday for a total of 6u PRBCs.  This morning plts had trended up nicely and creatinine was trending down nicely.  Will d/c serial labs and get another CBC and BMET at noon and if appropriate will continue to monitor clinically.  Pt is  no longer tachycardic and BP appropriate and stable.  Circ will be done shortly.  Questions answered and procedure reviewed.

## 2020-07-18 NOTE — Progress Notes (Addendum)
    Chart check-- IR  Rt pelvis hematoma- post C section 11/4  During the procedure she was noted to have brisk bleeding from the right corner of the uterine incision with extension in the right broad ligament which was closed with suture. She was later noted to have a distended abdomen and due to concern for postoperative hemorrhage a CTA abd/pelvis was obtained which noted no active extravasation in the right pelvis however a hematoma associated with the right aspect of the uterus and adnexa was noted.  Was seen by IR for possible embolization- No active extravasation on CTA Watching now with OB   Hg 10 this am-- stable Last Tx 11/5 1030 am 97.7; 109/81; 83; 96%  Discussed with Dr Miles Costain Will follow along for now Call if any changes or needs

## 2020-07-18 NOTE — Lactation Note (Signed)
This note was copied from a baby's chart. Lactation Consultation Note Baby 19 hrs old. Mom didn't want Lactation consult after delivery. Mom didn't feel that she needed LC services. Mom's nipples are getting sore so she asked for LC to come see her.  Mom BF her 1st child for 2 months. Mom stated she was only able to latch to the Rt. Breast and she pumped the Lt. Breast and gave it in a bottle.  Mom hasn't latched to the Lt. Nipple w/this baby. Mom has been BF in football position. Mom demonstrated hand expression. Colostrum noted. Praised mom.  Mom stated the Lt. Nipple is inverted. Noted short shaft w/top center of nipple w/slight indention but is everted to edges.  Areola and nipple tissue to be slightly thick feeling. W/finger stimulation and reverse pressure at the base of nipple noted softening.  In football position latched baby great. Noted a lot of swallows w/significant softening of breast. Praised mom.  Gave mom shells to wear as well as a hand pump for pre-pumping.  Mom excited about baby latching and no pain. Noted before latching when baby was crying baby had visible posterior tight frenulum w/tongue curling. Gave mom contact information for tongue tie. Explained to mom if it isn't affecting feeding the baby will probably not have to have anything done.  Newborn feeding habits, behavior, STS, I&O, breast massage, milk storage, positioning, support, safety, supply and demand discussed. Mom encouraged to feed baby 8-12 times/24 hours and with feeding cues.   Noted Donor BM in refrigerator. Mom stated that was her sisters milk. Informed mom that she would need to sign a consent about giving DBM.  Informed RN of Milk storage in ref.  Encouraged mom to call for assistance or questions. Lactation brochure given. Reported to RN of consult.  Patient Name: Grace Joseph GEXBM'W Date: 07/18/2020 Reason for consult: Initial assessment;Mother's request;Early term  24-38.6wks   Maternal Data Has patient been taught Hand Expression?: Yes Does the patient have breastfeeding experience prior to this delivery?: Yes  Feeding Feeding Type: Breast Fed  LATCH Score Latch: Grasps breast easily, tongue down, lips flanged, rhythmical sucking.  Audible Swallowing: Spontaneous and intermittent  Type of Nipple: Everted at rest and after stimulation  Comfort (Breast/Nipple): Filling, red/small blisters or bruises, mild/mod discomfort (Lt. nipple edema)  Hold (Positioning): Assistance needed to correctly position infant at breast and maintain latch.  LATCH Score: 8  Interventions Interventions: Breast feeding basics reviewed;Support pillows;Assisted with latch;Position options;Skin to skin;Breast massage;Hand express;Shells;Pre-pump if needed;Reverse pressure;Hand pump;Breast compression;Adjust position  Lactation Tools Discussed/Used Tools: Shells;Pump Shell Type: Inverted Breast pump type: Manual WIC Program: No Pump Review: Setup, frequency, and cleaning;Milk Storage Initiated by:: Peri Jefferson RN IBCLC Date initiated:: 07/18/20   Consult Status Consult Status: Follow-up Date: 07/19/20 Follow-up type: In-patient    Charyl Dancer 07/18/2020, 4:15 AM

## 2020-07-19 LAB — POCT I-STAT EG7
Acid-base deficit: 9 mmol/L — ABNORMAL HIGH (ref 0.0–2.0)
Bicarbonate: 18 mmol/L — ABNORMAL LOW (ref 20.0–28.0)
Calcium, Ion: 1.18 mmol/L (ref 1.15–1.40)
HCT: 19 % — ABNORMAL LOW (ref 36.0–46.0)
Hemoglobin: 6.5 g/dL — CL (ref 12.0–15.0)
O2 Saturation: 51 %
Potassium: 5.9 mmol/L — ABNORMAL HIGH (ref 3.5–5.1)
Sodium: 130 mmol/L — ABNORMAL LOW (ref 135–145)
TCO2: 19 mmol/L — ABNORMAL LOW (ref 22–32)
pCO2, Ven: 43.1 mmHg — ABNORMAL LOW (ref 44.0–60.0)
pH, Ven: 7.23 — ABNORMAL LOW (ref 7.250–7.430)
pO2, Ven: 32 mmHg (ref 32.0–45.0)

## 2020-07-19 MED ORDER — OXYCODONE HCL 5 MG PO TABS
5.0000 mg | ORAL_TABLET | ORAL | 0 refills | Status: AC | PRN
Start: 2020-07-19 — End: ?

## 2020-07-19 MED ORDER — ACETAMINOPHEN 500 MG PO TABS: 1000.0000 mg | ORAL_TABLET | Freq: Three times a day (TID) | ORAL | 0 refills | Status: AC

## 2020-07-19 MED ORDER — IBUPROFEN 800 MG PO TABS: 800.0000 mg | ORAL_TABLET | Freq: Three times a day (TID) | ORAL | 0 refills | Status: AC

## 2020-07-19 NOTE — Discharge Summary (Addendum)
OB Discharge Summary  Patient Name: Yarlin Breisch DOB: [birth year] MRN: [id number]  Date of admission: 07/14/2020 Delivering provider: Essie Hart   Admitting diagnosis: Normal labor [O80, Z37.9] Intrauterine pregnancy: [redacted]w[redacted]d     Secondary diagnosis: Patient Active Problem List   Diagnosis Date Noted  . Cesarean delivery - repeat for failed TOLAC 07/18/2020  . Postpartum care following cesarean delivery (11/3) 07/16/2020  . Acute blood loss anemia 07/16/2020  . PPH (postpartum hemorrhage) 07/16/2020  . Chorioamnionitis in third trimester 07/13/2020    Date of discharge: 07/19/2020   Discharge diagnosis: Principal Problem:   Postpartum care following cesarean delivery (11/3) Active Problems:   Chorioamnionitis in third trimester   Acute blood loss anemia   PPH (postpartum hemorrhage)   Cesarean delivery - repeat for failed TOLAC                                                              Post partum procedures:blood transfusion  Augmentation: AROM and Pitocin Pain control: Epidural  Laceration:None  Episiotomy:None  Complications: Intrauterine Inflammation or infection (Chorioamniotis) and Hemorrhage>1061mL  Hospital course:  Onset of Labor With Unplanned C/S   29 y.o. yo [id number] at [redacted]w[redacted]d was admitted in Latent Labor on 07/14/2020. Patient had a labor course significant for in labor for more than 36 hours with no progression past 6.5-7cm, episodes of repetitive late decelerations with contractions. The patient went for cesarean section due to Arrest of Dilation and Non-Reassuring FHR. Delivery details as follows: Membrane Rupture Time/Date: 6:17 AM ,07/15/2020   Delivery Method:C-Section, Vacuum Assisted  Details of operation can be found in separate operative note.  Patient had a complicated postpartum course due to postpartum hemorrhage and symptomatic anemia.  BP 89/52 and pulse 140. Abdomen is softly distended. Pressure dressing is in place. Tachycardic on exam and lungs clear.  BS are present and decreased on the right and fairly normal on the left.  She denies n/v/c/flatus. UOP has been adequate and appears concentrated.  Hgb 8.7 and nl coags.  Plts had been 133 but wnl now. Discussed transfusing an additional 2u PRBCs with the pt including r/b/a and pt would like to get the blood.  Pt will be transferred to [facility] specialty care for increased monitoring.  07-16-20 1900 As pt started receiving the transfusion her abdomen became markedly distended and tympanic.  I had a concern for a slow intraabdominal bleed when I saw pt at about noon.  A CBC was done stat and there was an appropriate rise in Hgb.but plts had decreased.  Concern for continuous bleed persisted but continued to observe with the anticipation that clotting factors and plts would eventually result in hemostasis.  I discussed options rba with pt including possible need to return to the OR if plts continued to be consumed. At this point however the 4th unit was due to start and plan was to continue to observe and do serial labs.  Pt continued to be tachycardic at this time.  Update from [facility], CNM reported worsening abdominal distention a couple of hours later.  Pt reevaluated by me around 3pm and plts had noted to decrease further but pt was starting to feel better.  Abdomen actually felt softer and less tympanic than earlier but with the decreased plts to 109 now from 120,  I was concerned the pt continued to have some intraabdominal bleeding.  Pt's creatinine at this time was also noted to be 2.05. Pt monitored closely options reviewed rba discussed and questions answered with her, FOB and her sister throughout this afternoon.  Decision made to get CTA after contacting interventional radiology for possible [place] if there was a bleed noted.  Prelim report by Dr. Ruthy Dick revealed 10cm hematoma near rt broad ligament, FF on the left, markedly distended bowels displaced anteriorly, no obvious obstruction and no  ureteral injury.  There was no extravasation of blood arterially and he suspected venous bleed.  Decision was made to observe with serial labs including DIC panel due to the continued dropping plts.    11/5 - Hemoglobin on PO #2 was 7. Transfused two more units of blood for a total of 6 units of PRBCs. Creatinine improving.   On 07/19/2020 patient is ambulating, tolerating a regular diet, passing flatus, and urinating well.  Patient is discharged home in stable condition 07/19/20.  Newborn Data: Birth date:07/15/2019  Birth time:11:57 PM  Gender:Female  Living status:Living  Apgars:5 ,8  Weight:3481 g   Physical exam  Vitals:   07/18/20 2035 07/18/20 2312 07/19/20 0545 07/19/20 0814  BP: 112/73 112/77 108/77 124/73  Pulse: 92  78 66  Resp: 17 18 17 18   Temp: 98.8 F (37.1 C) 98.4 F (36.9 C) 97.6 F (36.4 C) 98.5 F (36.9 C)  TempSrc: Oral Oral Oral Oral  SpO2: 98% 100% 100% 100%  Weight:      Height:       General: alert, cooperative and no distress Lochia: appropriate Uterine Fundus: firm Incision: Healing well with no significant drainage, Dressing is clean, dry, and intact DVT Evaluation: No evidence of DVT seen on physical exam. No significant calf/ankle edema. Labs: Lab Results  Component Value Date   WBC 12.5 (H) 07/18/2020   HGB 10.4 (L) 07/18/2020   HCT 30.6 (L) 07/18/2020   MCV 86.7 07/18/2020   PLT 155 07/18/2020   CMP Latest Ref Rng & Units 07/18/2020  Glucose 70 - 99 mg/dL 87  BUN 6 - 20 mg/dL 8  Creatinine 13/02/2020 - 3.41 mg/dL 9.62)  Sodium 2.29(N - 989 mmol/L 138  Potassium 3.5 - 5.1 mmol/L 4.2  Chloride 98 - 111 mmol/L 107  CO2 22 - 32 mmol/L 22  Calcium 8.9 - 10.3 mg/dL 8.2(L)  Total Protein 6.5 - 8.1 g/dL -  Total Bilirubin 0.3 - 1.2 mg/dL -  Alkaline Phos 38 - 211 U/L -  AST 15 - 41 U/L -  ALT 0 - 44 U/L -   No flowsheet data found.  Vaccines: TDaP Declined         Flu    Declined         COVID-19   Declined  Discharge instructions:  per  After Visit Summary   After Visit Meds:  Allergies as of 07/19/2020   No Known Allergies     Medication List    STOP taking these medications   ferrous sulfate 325 (65 FE) MG tablet   JUICE PLUS FIBRE PO     TAKE these medications   acetaminophen 500 MG tablet Commonly known as: TYLENOL Take 2 tablets (1,000 mg total) by mouth every 8 (eight) hours.   ibuprofen 800 MG tablet Commonly known as: ADVIL Take 1 tablet (800 mg total) by mouth every 8 (eight) hours.   oxyCODONE 5 MG immediate release tablet Commonly known as: Oxy IR/ROXICODONE  Take 1-2 tablets (5-10 mg total) by mouth every 4 (four) hours as needed for moderate pain.   prenatal multivitamin Tabs tablet Take 1 tablet by mouth daily at 12 noon.       Diet: routine diet  Activity: Advance as tolerated. Pelvic rest for 6 weeks.   Newborn Data: Live born female  Birth Weight: 7 lb 10.8 oz (3481 g) APGAR: 5, 8  Newborn Delivery   Birth date/time: 07/15/2020 23:57:00 Delivery type: C-Section, Vacuum Assisted Trial of labor: Yes C-section categorization: Repeat       Named Trei Baby Feeding: Breast Disposition:home with mother  Delivery Report:  Review the Delivery Report for details.    Follow up:  Follow-up Information    [facility]. Schedule an appointment as soon as possible for a visit in 1 week(s).   Specialty: Obstetrics and Gynecology Why: Please make an appointment for 1 week postpartum.  Contact information: [address] [place] [address] [phone]              June Leap, CNM, MSN 07/19/2020, 11:42 AM

## 2020-07-19 NOTE — Lactation Note (Signed)
This note was copied from a baby's chart. Lactation Consultation Note  Patient Name: Grace Joseph FEXMD'Y Date: 07/19/2020 Reason for consult: Follow-up assessment;Early term 81-38.6wks Baby 42hrs old, upon entering room mom sitting in bed, baby asleep next to mom, dad preparing/ packing for discharge. Mom reports plans are to continue to work on latch at breast and supplement with formula until baby no longer needs. Mom denies having a DEBP at home. Reinforced feed on cue, wake if >3hrs since last feeding, monitor wet and stool diapers for signs of adequate milk intake, advised DEBP rentals available at gift shop and to check pump options through insurance company, discussed hand pump vs DEBPs for milk removal and stimulation. Encouraged to pump after feedings and offer EBM back to baby. Gave [facility] BF brochure, encouraged mom to schedule outpatient appointment as needed and/or call for [facility] support, discussed local BF support groups. Mom voiced understanding and with no further concerns. BGilliam, RN, IBCLC   Interventions Interventions: Breast feeding basics reviewed  Lactation Tools Discussed/Used     Consult Status Consult Status: Complete Date: 07/19/20    Grace Joseph 07/19/2020, 2:22 PM

## 2020-07-28 ENCOUNTER — Other Ambulatory Visit: Payer: Self-pay

## 2020-07-28 ENCOUNTER — Inpatient Hospital Stay (HOSPITAL_COMMUNITY)
Admission: AD | Admit: 2020-07-28 | Discharge: 2020-07-28 | Disposition: A | Discharge status: Home / Self Care | Payer: Medicaid Other | Attending: Obstetrics and Gynecology | Admitting: Obstetrics and Gynecology

## 2020-07-28 DIAGNOSIS — G44209 Tension-type headache, unspecified, not intractable: Secondary | ICD-10-CM | POA: Diagnosis not present

## 2020-07-28 DIAGNOSIS — O165 Unspecified maternal hypertension, complicating the puerperium: Secondary | ICD-10-CM | POA: Insufficient documentation

## 2020-07-28 DIAGNOSIS — Z7901 Long term (current) use of anticoagulants: Secondary | ICD-10-CM | POA: Insufficient documentation

## 2020-07-28 DIAGNOSIS — R519 Headache, unspecified: Secondary | ICD-10-CM | POA: Diagnosis not present

## 2020-07-28 DIAGNOSIS — O99893 Other specified diseases and conditions complicating puerperium: Secondary | ICD-10-CM

## 2020-07-28 DIAGNOSIS — Z79899 Other long term (current) drug therapy: Secondary | ICD-10-CM | POA: Diagnosis not present

## 2020-07-28 LAB — COMPREHENSIVE METABOLIC PANEL
ALT: 18 U/L (ref 0–44)
AST: 23 U/L (ref 15–41)
Albumin: 2.9 g/dL — ABNORMAL LOW (ref 3.5–5.0)
Alkaline Phosphatase: 83 U/L (ref 38–126)
Anion gap: 8 (ref 5–15)
BUN: 15 mg/dL (ref 6–20)
CO2: 22 mmol/L (ref 22–32)
Calcium: 8.9 mg/dL (ref 8.9–10.3)
Chloride: 110 mmol/L (ref 98–111)
Creatinine, Ser: 1.02 mg/dL — ABNORMAL HIGH (ref 0.44–1.00)
GFR, Estimated: >60 mL/min (ref >60)
Glucose, Bld: 95 mg/dL (ref 70–99)
Potassium: 4 mmol/L (ref 3.5–5.1)
Sodium: 140 mmol/L (ref 135–145)
Total Bilirubin: 0.5 mg/dL (ref 0.3–1.2)
Total Protein: 6.1 g/dL — ABNORMAL LOW (ref 6.5–8.1)

## 2020-07-28 LAB — CBC
HCT: 32.6 % — ABNORMAL LOW (ref 36.0–46.0)
Hemoglobin: 10.6 g/dL — ABNORMAL LOW (ref 12.0–15.0)
MCH: 29.6 pg (ref 26.0–34.0)
MCHC: 32.5 g/dL (ref 30.0–36.0)
MCV: 91.1 fL (ref 80.0–100.0)
Platelets: 432 10*3/uL — ABNORMAL HIGH (ref 150–400)
RBC: 3.58 MIL/uL — ABNORMAL LOW (ref 3.87–5.11)
RDW: 15.3 % (ref 11.5–15.5)
WBC: 12 10*3/uL — ABNORMAL HIGH (ref 4.0–10.5)
nRBC: 0 % (ref 0.0–0.2)

## 2020-07-28 MED ORDER — BUTALBITAL-APAP-CAFFEINE 50-325-40 MG PO TABS
1.0000 | ORAL_TABLET | Freq: Once | ORAL | Status: AC
  Administered 2020-07-28: 1 via ORAL
  Filled 2020-07-28: qty 1

## 2020-07-28 MED ORDER — AMLODIPINE BESYLATE 5 MG PO TABS: 5.0000 mg | ORAL_TABLET | Freq: Every day | ORAL | 0 refills | Status: AC

## 2020-07-28 NOTE — Discharge Instructions (Signed)

## 2020-07-28 NOTE — MAU Provider Note (Signed)
History     CSN: [id number]  Arrival date and time: 07/28/20 0110   First Provider Initiated Contact with Patient 07/28/20 0157     29 y.o. G2P2 s/p CS 13 days ago presenting via EMS for increased VB. Reports onset of large amt of dark red bleeding about 1 hr ago. Prior to this time her bleeding had been minimal. She denies lifting or strenuous activity. Also reports right temporal HA today. She took Tylenol but it didn't help. Denies CP, SOB, visual disturbance, and RUQ pain. Her pregnancy was complicated by previous CS and her most recent CS was done for failed attempt at [facility] and chorioamnionitis. She also had a PPH and post-op hematoma and received a total of 6 units of PRBCs and albumin.   OB History    Gravida  2   Para  2   Term  2   Preterm      AB      Living  2     SAB      TAB      Ectopic      Multiple  0   Live Births  2           Past Medical History:  Diagnosis Date  . Medical history non-contributory     Past Surgical History:  Procedure Laterality Date  . CESAREAN SECTION N/A 05/31/2016   Procedure: CESAREAN SECTION;  Surgeon: Lynd Bing, MD;  Location: [facility];  Service: Obstetrics;  Laterality: N/A;  . CESAREAN SECTION N/A 07/15/2020   Procedure: CESAREAN SECTION;  Surgeon: Essie Hart, MD;  Location: [facility];  Service: Obstetrics;  Laterality: N/A;  . NO PAST SURGERIES      Family History  Problem Relation Age of Onset  . Hypertension Mother     Social History   Tobacco Use  . Smoking status: Never Smoker  . Smokeless tobacco: Never Used  Vaping Use  . Vaping Use: Never used  Substance Use Topics  . Alcohol use: Not Currently  . Drug use: No    Allergies: No Known Allergies  No medications prior to admission.    Review of Systems  Eyes: Negative for visual disturbance.  Respiratory: Negative for shortness of breath.   Cardiovascular: Negative for chest pain.  Gastrointestinal: Negative for abdominal  pain.  Genitourinary: Positive for vaginal bleeding.  Neurological: Positive for headaches.   Physical Exam   Blood pressure 134/87, pulse 88, temperature 98 F (36.7 C), temperature source Oral, resp. rate 18, SpO2 100 %, unknown if currently breastfeeding. Patient Vitals for the past 24 hrs:  BP Temp Temp src Pulse Resp SpO2  07/28/20 0430 -- 98 F (36.7 C) Oral -- 18 --  07/28/20 0401 134/87 -- -- 88 -- --  07/28/20 0330 138/90 -- -- 88 -- --  07/28/20 0300 139/86 -- -- 95 -- --  07/28/20 0146 126/81 -- -- (!) 110 -- --  07/28/20 0138 (!) 143/91 -- -- (!) 108 -- --  07/28/20 0133 140/85 -- -- (!) 101 -- --  07/28/20 0125 -- -- -- -- -- 100 %  07/28/20 0124 (!) 147/86 98.6 F (37 C) Oral (!) 111 18 --   Physical Exam Vitals and nursing note reviewed. Exam conducted with a chaperone present.  Constitutional:      General: She is not in acute distress.    Appearance: Normal appearance.  HENT:     Head: Normocephalic and atraumatic.  Pulmonary:     Effort:  Pulmonary effort is normal. No respiratory distress.  Abdominal:     General: There is no distension.     Palpations: Abdomen is soft.     Tenderness: There is no abdominal tenderness.     Comments: Incision well approximated, no edema, erythema, or drainage  Genitourinary:    Comments: External: no lesions or erythema Vagina: rugated, pink, moist, scant dark red discharge Uterus: + enlarged, anteverted, non tender, no CMT  Musculoskeletal:        General: Normal range of motion.     Cervical back: Normal range of motion.  Skin:    General: Skin is warm and dry.  Neurological:     General: No focal deficit present.     Mental Status: She is alert and oriented to person, place, and time.  Psychiatric:        Mood and Affect: Mood normal.        Behavior: Behavior normal.    Results for orders placed or performed during the hospital encounter of 07/28/20 (from the past 24 hour(s))  CBC     Status: Abnormal    Collection Time: 07/28/20  1:54 AM  Result Value Ref Range   WBC 12.0 (H) 4.0 - 10.5 K/uL   RBC 3.58 (L) 3.87 - 5.11 MIL/uL   Hemoglobin 10.6 (L) 12.0 - 15.0 g/dL   HCT 31.4 (L) 36 - 46 %   MCV 91.1 80.0 - 100.0 fL   MCH 29.6 26.0 - 34.0 pg   MCHC 32.5 30.0 - 36.0 g/dL   RDW 97.0 26.3 - 78.5 %   Platelets 432 (H) 150 - 400 K/uL   nRBC 0.0 0.0 - 0.2 %  Comprehensive metabolic panel     Status: Abnormal   Collection Time: 07/28/20  1:54 AM  Result Value Ref Range   Sodium 140 135 - 145 mmol/L   Potassium 4.0 3.5 - 5.1 mmol/L   Chloride 110 98 - 111 mmol/L   CO2 22 22 - 32 mmol/L   Glucose, Bld 95 70 - 99 mg/dL   BUN 15 6 - 20 mg/dL   Creatinine, Ser 8.85 (H) 0.44 - 1.00 mg/dL   Calcium 8.9 8.9 - 02.7 mg/dL   Total Protein 6.1 (L) 6.5 - 8.1 g/dL   Albumin 2.9 (L) 3.5 - 5.0 g/dL   AST 23 15 - 41 U/L   ALT 18 0 - 44 U/L   Alkaline Phosphatase 83 38 - 126 U/L   Total Bilirubin 0.5 0.3 - 1.2 mg/dL   GFR, Estimated >74 >12 mL/min   Anion gap 8 5 - 15   MAU Course  Procedures  MDM Labs ordered and reviewed. No signs of PEC. Ct elevated but trending down from postpartum value on 11/4 (2.05). HA improved. Will start Norvasc 5mg . Lochia minimal, likely eschar bleeding episode earlier. Stable for discharge home.   Assessment and Plan   1. Postpartum hypertension   2. Acute non intractable tension-type headache    Discharge home Follow up at [facility] in 2-3 days for BP check , CNM notified Rx Norvasc PEC precautions Bleeding precautions  Allergies as of 07/28/2020   No Known Allergies     Medication List    TAKE these medications   acetaminophen 500 MG tablet Commonly known as: TYLENOL Take 2 tablets (1,000 mg total) by mouth every 8 (eight) hours.   amLODipine 5 MG tablet Commonly known as: NORVASC Take 1 tablet (5 mg total) by mouth daily.   ibuprofen 800  MG tablet Commonly known as: ADVIL Take 1 tablet (800 mg total) by mouth every 8 (eight) hours.    oxyCODONE 5 MG immediate release tablet Commonly known as: Oxy IR/ROXICODONE Take 1-2 tablets (5-10 mg total) by mouth every 4 (four) hours as needed for moderate pain.   prenatal multivitamin Tabs tablet Take 1 tablet by mouth daily at 12 noon.       Donette Larry, CNM 07/28/2020, 4:34 AM

## 2021-02-22 IMAGING — US US MFM FETAL BPP W/O NON-STRESS
1 series · 12 of 17 positions shown · non-contrast
Comparison: none

[Series 1: us mfm fetal bpp w/o non-stress · 17 acquisitions, 12 frames shown]
[im 1/17]
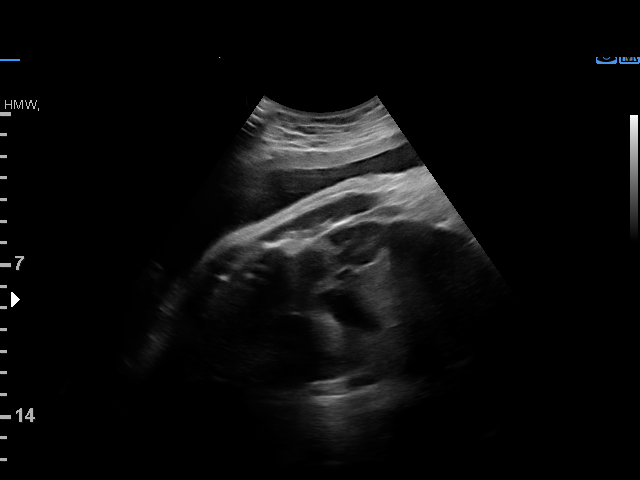
[im 3/17]
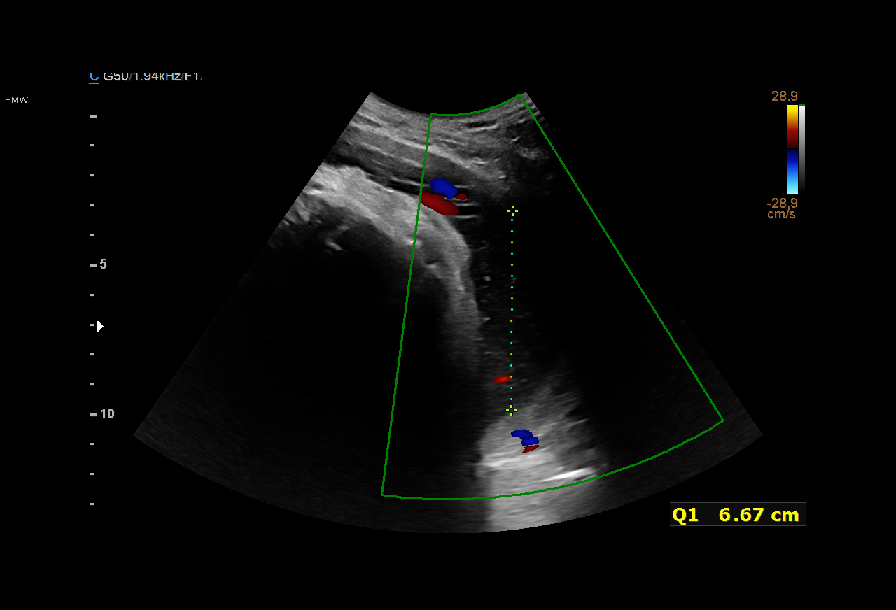
[im 4/17]
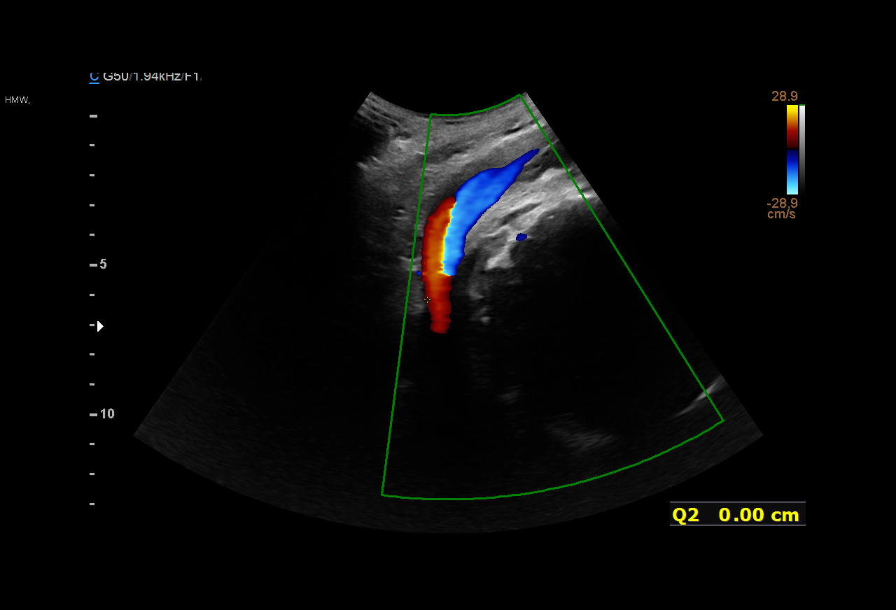
[im 5/17]
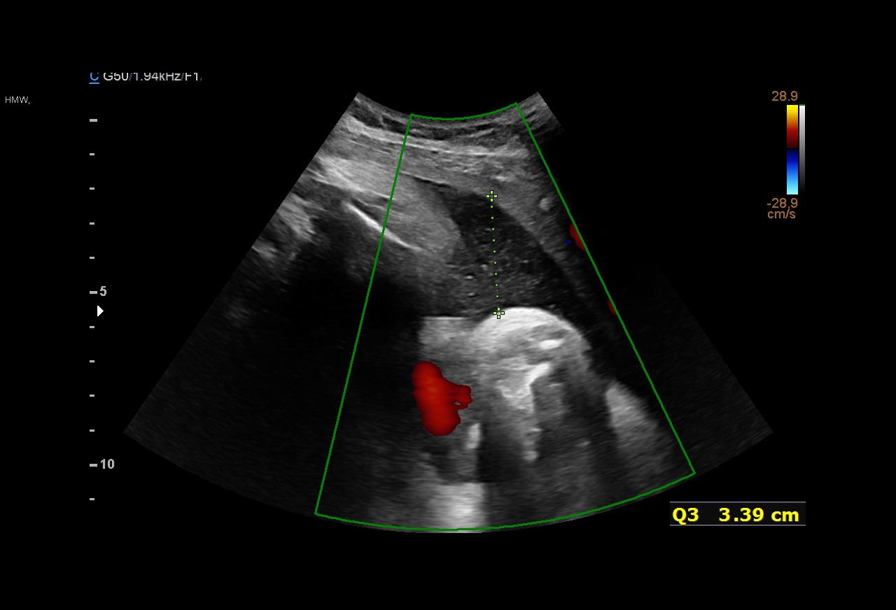
[im 7/17]
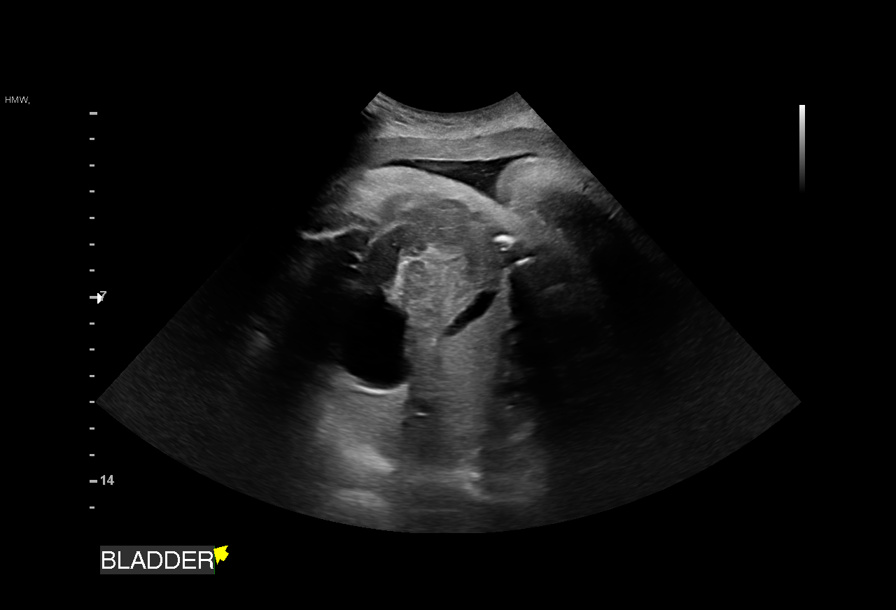
[im 8/17]
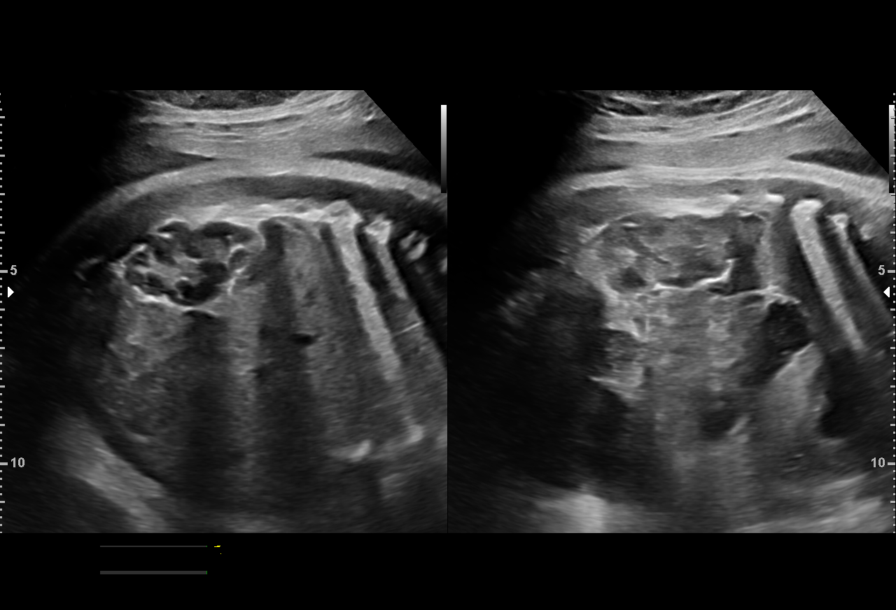
[im 10/17]
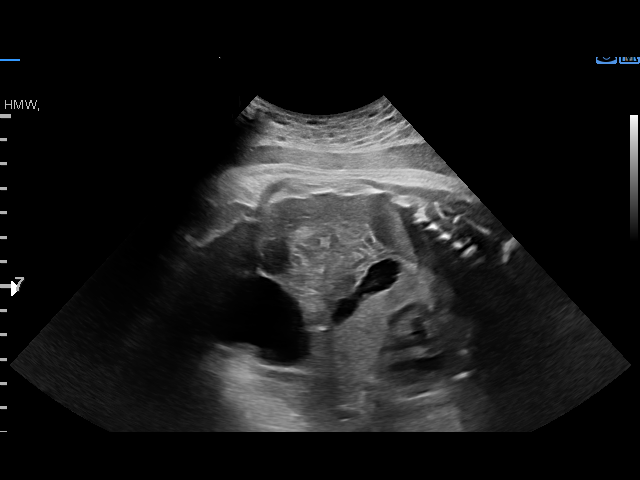
[im 11/17]
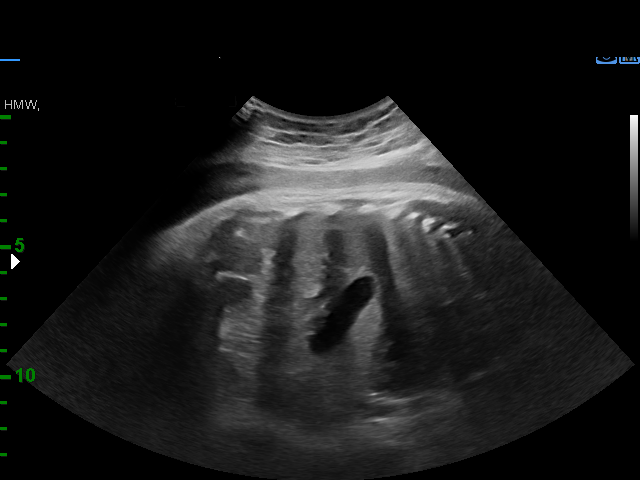
[im 13/17]
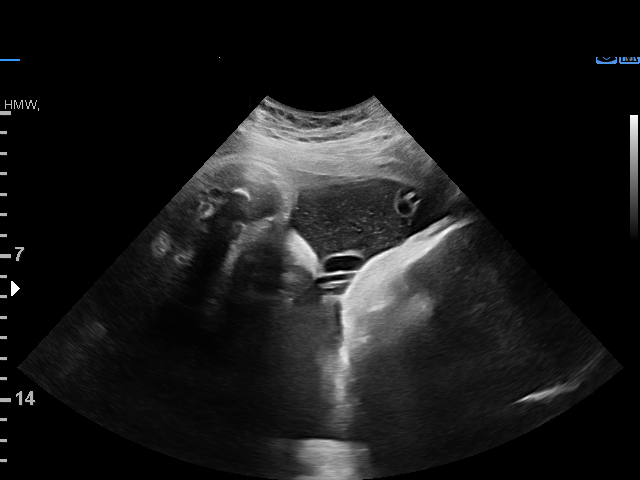
[im 14/17]
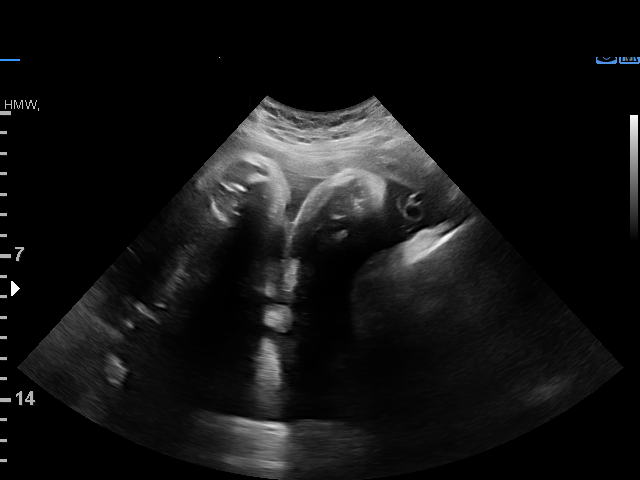
[im 15/17]
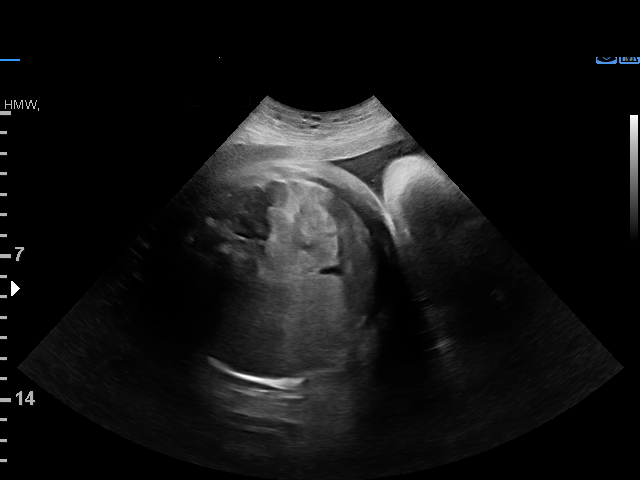
[im 17/17]
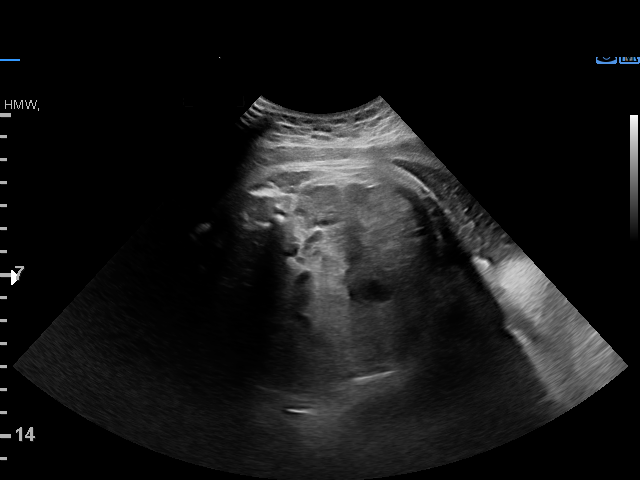

[12 of 17 positions shown; findings below may reference images not displayed]

Indications

 Non-reactive NST
 38 weeks gestation of pregnancy
 Encounter for antenatal screening for
 malformations
Fetal Evaluation

 Num Of Fetuses:         1
 Cardiac Activity:       Observed
 Presentation:           Cephalic

 Amniotic Fluid
 AFI FV:      Within normal limits

 AFI Sum(cm)     %Tile       Largest Pocket(cm)
 14.4            56

 RUQ(cm)       RLQ(cm)       LUQ(cm)        LLQ(cm)
 6.7           4.3           0
Biophysical Evaluation

 Amniotic F.V:   Within normal limits       F. Tone:        Observed
 F. Movement:    Observed                   Score:          [DATE]
 F. Breathing:   Observed
OB History

 Gravidity:    2         Term:   1        Prem:   0        SAB:   0
 TOP:          0       Ectopic:  0        Living: 1
Gestational Age

 Clinical EDD:  38w 3d                                        EDD:   07/25/20
 Best:          38w 3d     Det. By:  Clinical EDD             EDD:   07/25/20
Anatomy

 Thoracic:              Appears normal         Kidneys:                Appear normal
 Stomach:               Appears normal, left   Bladder:                Appears normal
                        sided
Impression

 Patient was evaluated in the TIGER to rule out labor.  BPP was
 requested because of nonreactive NST.
 Amniotic fluid is normal and good fetal activity is seen
 .Antenatal testing is reassuring. BPP [DATE].  Cephalic
 presentation.
                 [place], [place]

## 2022-06-22 ENCOUNTER — Emergency Department (HOSPITAL_COMMUNITY)
Admission: EM | Admit: 2022-06-22 | Discharge: 2022-06-23 | Discharge status: Against Medical Advice | Payer: Medicaid Other | Attending: Student | Admitting: Student

## 2022-06-22 ENCOUNTER — Encounter (HOSPITAL_COMMUNITY): Payer: Self-pay | Admitting: Emergency Medicine

## 2022-06-22 ENCOUNTER — Other Ambulatory Visit: Payer: Self-pay

## 2022-06-22 DIAGNOSIS — R0989 Other specified symptoms and signs involving the circulatory and respiratory systems: Secondary | ICD-10-CM | POA: Insufficient documentation

## 2022-06-22 DIAGNOSIS — J029 Acute pharyngitis, unspecified: Secondary | ICD-10-CM | POA: Diagnosis present

## 2022-06-22 DIAGNOSIS — Z5321 Procedure and treatment not carried out due to patient leaving prior to being seen by health care provider: Secondary | ICD-10-CM | POA: Insufficient documentation

## 2022-06-22 NOTE — ED Triage Notes (Signed)
Pt presented to ED with c/o sore throat x1 week.

## 2022-06-22 NOTE — ED Provider Triage Note (Signed)
Emergency Medicine Provider Triage Evaluation Note  Grace Joseph , a 31 y.o. female  was evaluated in triage.  Pt complains of sore throat x 3 weeks. Constant, seems to be getting worse, some associated congestion. Denies fever, vomiting, or dyspnea.  Review of Systems  Per above  Physical Exam  BP 130/78 (BP Location: Right Arm)   Pulse 86   Temp 98 F (36.7 C) (Oral)   Resp 18   SpO2 100%  Gen:   Awake, no distress   Resp:  Normal effort  MSK:   Moves extremities without difficulty  Other:  Tolerate own secretions w/o difficulty  Medical Decision Making  Medically screening exam initiated at 11:37 PM.  Appropriate orders placed.  Grace Joseph was informed that the remainder of the evaluation will be completed by another provider, this initial triage assessment does not replace that evaluation, and the importance of remaining in the ED until their evaluation is complete.  Sore throat   Grace Dyke, PA-C 06/22/22 2338

## 2022-06-23 LAB — GROUP A STREP BY PCR: Group A Strep by PCR: NOT DETECTED

## 2022-06-23 LAB — MONONUCLEOSIS SCREEN: Mono Screen: NEGATIVE
